# Patient Record
Sex: Male | Born: 1940 | Race: White | Hispanic: No | Smoking: Former smoker
Health system: Southern US, Community
[De-identification: ages and names within clinical notes are randomized; demographics above are authoritative.]

## PROBLEM LIST (undated history)

## (undated) DIAGNOSIS — S46219A Strain of muscle, fascia and tendon of other parts of biceps, unspecified arm, initial encounter: Secondary | ICD-10-CM

## (undated) DIAGNOSIS — M1990 Unspecified osteoarthritis, unspecified site: Secondary | ICD-10-CM

## (undated) DIAGNOSIS — I10 Essential (primary) hypertension: Secondary | ICD-10-CM

## (undated) DIAGNOSIS — C434 Malignant melanoma of scalp and neck: Secondary | ICD-10-CM

## (undated) DIAGNOSIS — K922 Gastrointestinal hemorrhage, unspecified: Secondary | ICD-10-CM

## (undated) DIAGNOSIS — Z87442 Personal history of urinary calculi: Secondary | ICD-10-CM

## (undated) DIAGNOSIS — I779 Disorder of arteries and arterioles, unspecified: Secondary | ICD-10-CM

## (undated) DIAGNOSIS — Z86010 Personal history of colonic polyps: Secondary | ICD-10-CM

## (undated) DIAGNOSIS — M5412 Radiculopathy, cervical region: Secondary | ICD-10-CM

## (undated) DIAGNOSIS — Z9889 Other specified postprocedural states: Secondary | ICD-10-CM

## (undated) DIAGNOSIS — K6389 Other specified diseases of intestine: Secondary | ICD-10-CM

## (undated) DIAGNOSIS — E119 Type 2 diabetes mellitus without complications: Secondary | ICD-10-CM

## (undated) DIAGNOSIS — I739 Peripheral vascular disease, unspecified: Secondary | ICD-10-CM

## (undated) DIAGNOSIS — I480 Paroxysmal atrial fibrillation: Secondary | ICD-10-CM

## (undated) HISTORY — DX: Personal history of urinary calculi: Z87.442

## (undated) HISTORY — DX: Peripheral vascular disease, unspecified: I73.9

## (undated) HISTORY — DX: Type 2 diabetes mellitus without complications: E11.9

## (undated) HISTORY — DX: Unspecified osteoarthritis, unspecified site: M19.90

## (undated) HISTORY — PX: LATERAL FUSION LUMBAR SPINE, THORACOTOMY: SUR634

## (undated) HISTORY — DX: Other specified postprocedural states: Z98.890

## (undated) HISTORY — DX: Paroxysmal atrial fibrillation: I48.0

## (undated) HISTORY — DX: Personal history of colonic polyps: Z86.010

## (undated) HISTORY — DX: Malignant melanoma of scalp and neck: C43.4

## (undated) HISTORY — DX: Strain of muscle, fascia and tendon of other parts of biceps, unspecified arm, initial encounter: S46.219A

## (undated) HISTORY — DX: Gastrointestinal hemorrhage, unspecified: K92.2

## (undated) HISTORY — PX: JOINT REPLACEMENT: SHX530

## (undated) HISTORY — DX: Disorder of arteries and arterioles, unspecified: I77.9

## (undated) HISTORY — DX: Essential (primary) hypertension: I10

## (undated) HISTORY — DX: Radiculopathy, cervical region: M54.12

---

## 2004-11-11 HISTORY — PX: LUMBAR LAMINECTOMY: SHX95

## 2006-03-05 ENCOUNTER — Encounter: Admission: RE | Admit: 2006-03-05 | Discharge: 2006-03-05 | Payer: Self-pay | Admitting: Neurosurgery

## 2006-03-24 ENCOUNTER — Inpatient Hospital Stay (HOSPITAL_COMMUNITY): Admission: RE | Admit: 2006-03-24 | Discharge: 2006-03-25 | Payer: Self-pay | Admitting: Neurosurgery

## 2006-05-01 DIAGNOSIS — M109 Gout, unspecified: Secondary | ICD-10-CM | POA: Insufficient documentation

## 2006-07-04 ENCOUNTER — Encounter: Admission: RE | Admit: 2006-07-04 | Discharge: 2006-07-04 | Payer: Self-pay | Admitting: Neurosurgery

## 2006-07-05 ENCOUNTER — Encounter: Admission: RE | Admit: 2006-07-05 | Discharge: 2006-07-05 | Payer: Self-pay | Admitting: Neurosurgery

## 2006-08-08 ENCOUNTER — Ambulatory Visit: Payer: Self-pay | Admitting: Family Medicine

## 2006-08-11 ENCOUNTER — Ambulatory Visit: Payer: Self-pay | Admitting: Family Medicine

## 2006-09-11 ENCOUNTER — Ambulatory Visit: Payer: Self-pay | Admitting: Family Medicine

## 2006-10-11 ENCOUNTER — Ambulatory Visit: Payer: Self-pay | Admitting: Family Medicine

## 2007-05-20 ENCOUNTER — Ambulatory Visit: Payer: Self-pay | Admitting: Neurosurgery

## 2007-11-12 DIAGNOSIS — N400 Enlarged prostate without lower urinary tract symptoms: Secondary | ICD-10-CM | POA: Insufficient documentation

## 2008-11-09 ENCOUNTER — Ambulatory Visit: Payer: Self-pay | Admitting: Sports Medicine

## 2008-11-11 HISTORY — PX: TOTAL HIP ARTHROPLASTY: SHX124

## 2008-11-11 HISTORY — PX: ULNAR NERVE REPAIR: SHX2594

## 2009-01-19 ENCOUNTER — Encounter: Admission: RE | Admit: 2009-01-19 | Discharge: 2009-01-19 | Payer: Self-pay | Admitting: Neurosurgery

## 2009-02-20 ENCOUNTER — Ambulatory Visit: Payer: Self-pay | Admitting: General Practice

## 2009-03-06 ENCOUNTER — Inpatient Hospital Stay: Payer: Self-pay | Admitting: General Practice

## 2009-05-23 ENCOUNTER — Ambulatory Visit (HOSPITAL_COMMUNITY): Admission: RE | Admit: 2009-05-23 | Discharge: 2009-05-24 | Payer: Self-pay | Admitting: Neurosurgery

## 2009-08-02 ENCOUNTER — Encounter: Payer: Self-pay | Admitting: Cardiovascular Disease

## 2009-08-02 LAB — CONVERTED CEMR LAB: Cholesterol: 165 mg/dL

## 2009-08-03 DIAGNOSIS — K219 Gastro-esophageal reflux disease without esophagitis: Secondary | ICD-10-CM | POA: Insufficient documentation

## 2009-11-11 DIAGNOSIS — C434 Malignant melanoma of scalp and neck: Secondary | ICD-10-CM

## 2009-11-11 HISTORY — PX: NERVE SURGERY: SHX1016

## 2009-11-11 HISTORY — DX: Malignant melanoma of scalp and neck: C43.4

## 2009-11-11 HISTORY — PX: OTHER SURGICAL HISTORY: SHX169

## 2010-01-08 ENCOUNTER — Encounter: Admission: RE | Admit: 2010-01-08 | Discharge: 2010-01-08 | Payer: Self-pay | Admitting: Neurosurgery

## 2010-03-06 ENCOUNTER — Encounter: Payer: Self-pay | Admitting: Cardiovascular Disease

## 2010-03-06 DIAGNOSIS — R972 Elevated prostate specific antigen [PSA]: Secondary | ICD-10-CM

## 2010-03-06 LAB — CONVERTED CEMR LAB
Alkaline Phosphatase: 80 units/L
BUN: 42 mg/dL
Calcium: 9.4 mg/dL
Chloride: 106 meq/L
Creatinine, Ser: 1.34 mg/dL
Glucose, Bld: 104 mg/dL
Hgb A1c MFr Bld: 6.5 %
Potassium: 4.5 meq/L
Sodium: 144 meq/L
TSH: 1.55 microintl units/mL

## 2010-03-09 ENCOUNTER — Ambulatory Visit: Payer: Self-pay | Admitting: General Practice

## 2010-03-28 ENCOUNTER — Encounter: Payer: Self-pay | Admitting: Cardiovascular Disease

## 2010-04-06 ENCOUNTER — Ambulatory Visit: Payer: Self-pay | Admitting: Cardiovascular Disease

## 2010-04-06 DIAGNOSIS — E1121 Type 2 diabetes mellitus with diabetic nephropathy: Secondary | ICD-10-CM | POA: Insufficient documentation

## 2010-04-06 DIAGNOSIS — E782 Mixed hyperlipidemia: Secondary | ICD-10-CM

## 2010-04-06 DIAGNOSIS — R079 Chest pain, unspecified: Secondary | ICD-10-CM | POA: Insufficient documentation

## 2010-04-06 DIAGNOSIS — I10 Essential (primary) hypertension: Secondary | ICD-10-CM

## 2010-07-16 IMAGING — CR DG OUTSIDE FILMS BODY
5 series · 5 of 5 positions shown · non-contrast
Comparison: none

[view not recorded (1 of 5)]
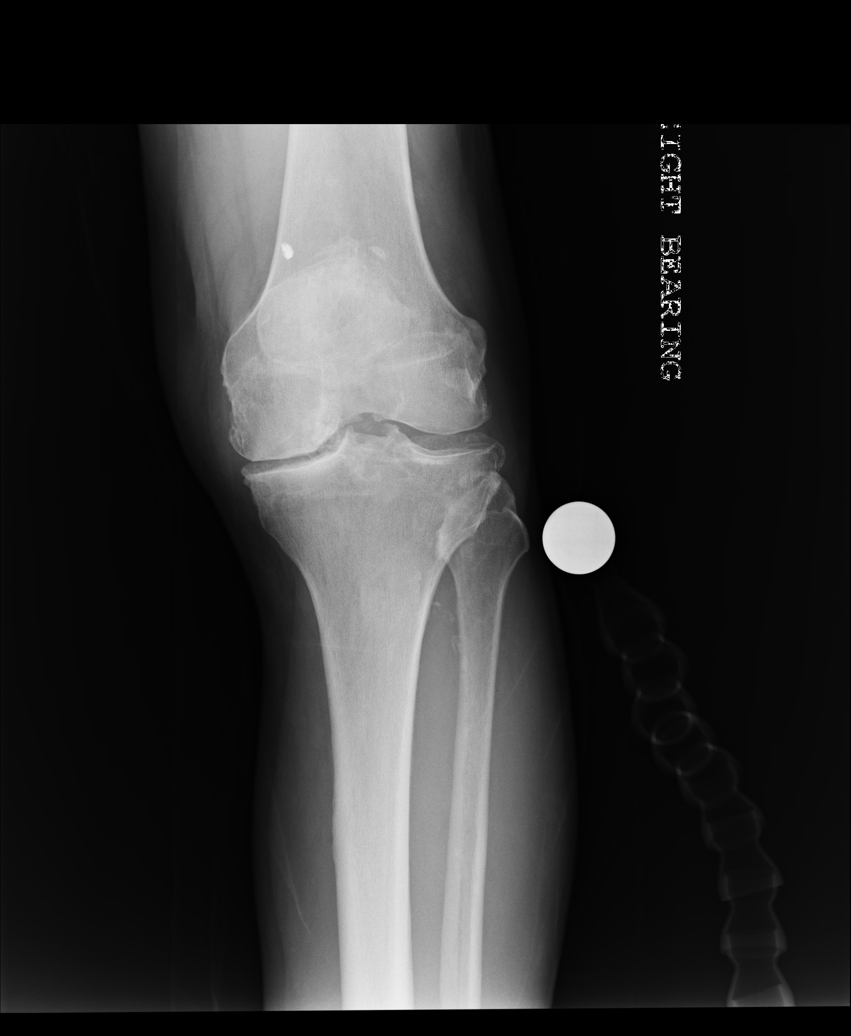

[view not recorded (2 of 5)]
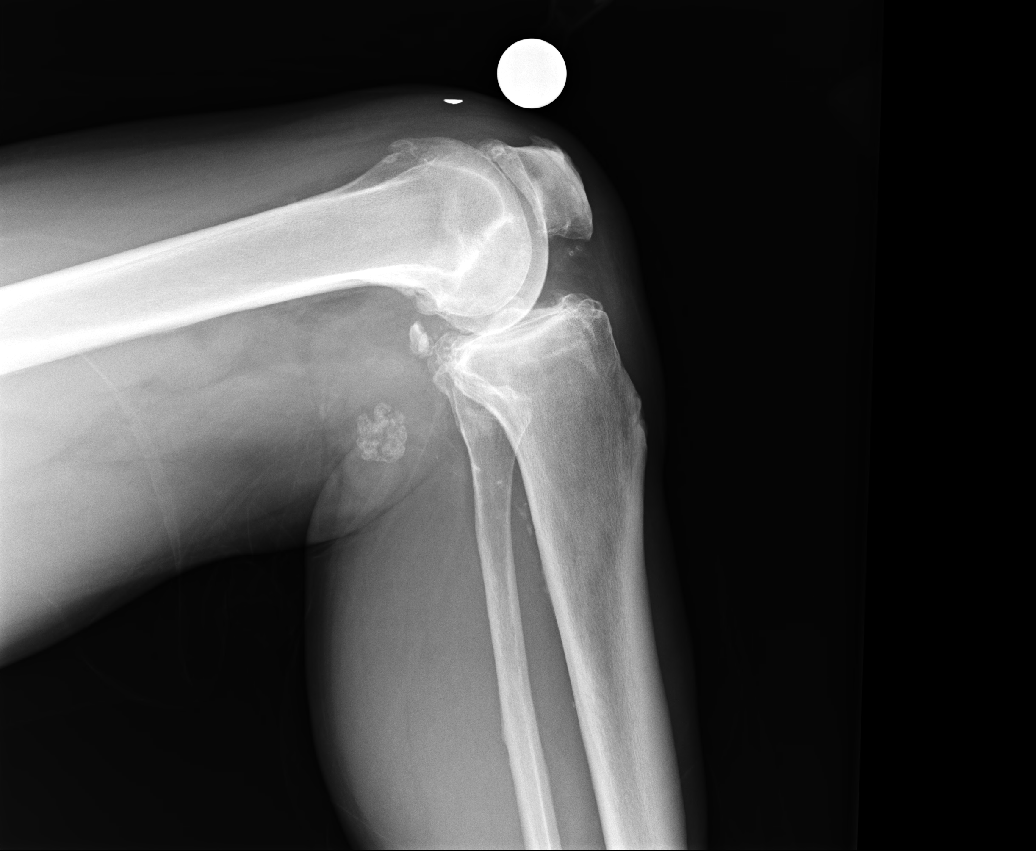

[view not recorded (3 of 5)]
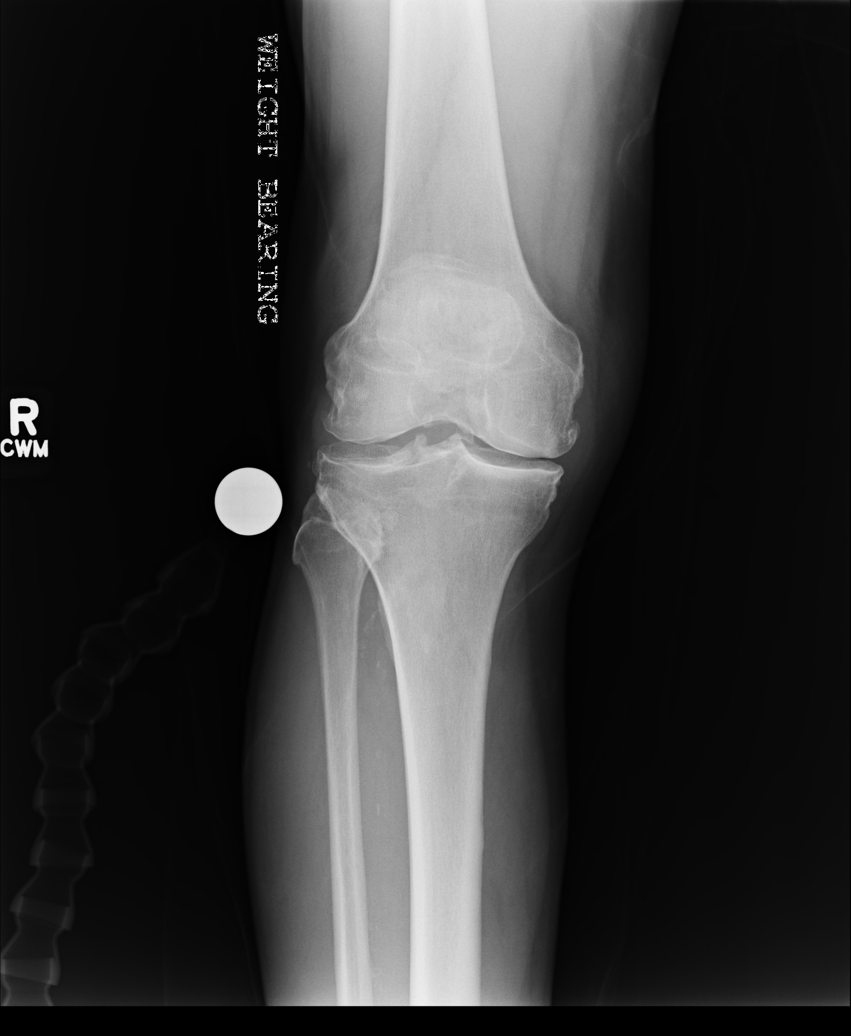

[view not recorded (4 of 5)]
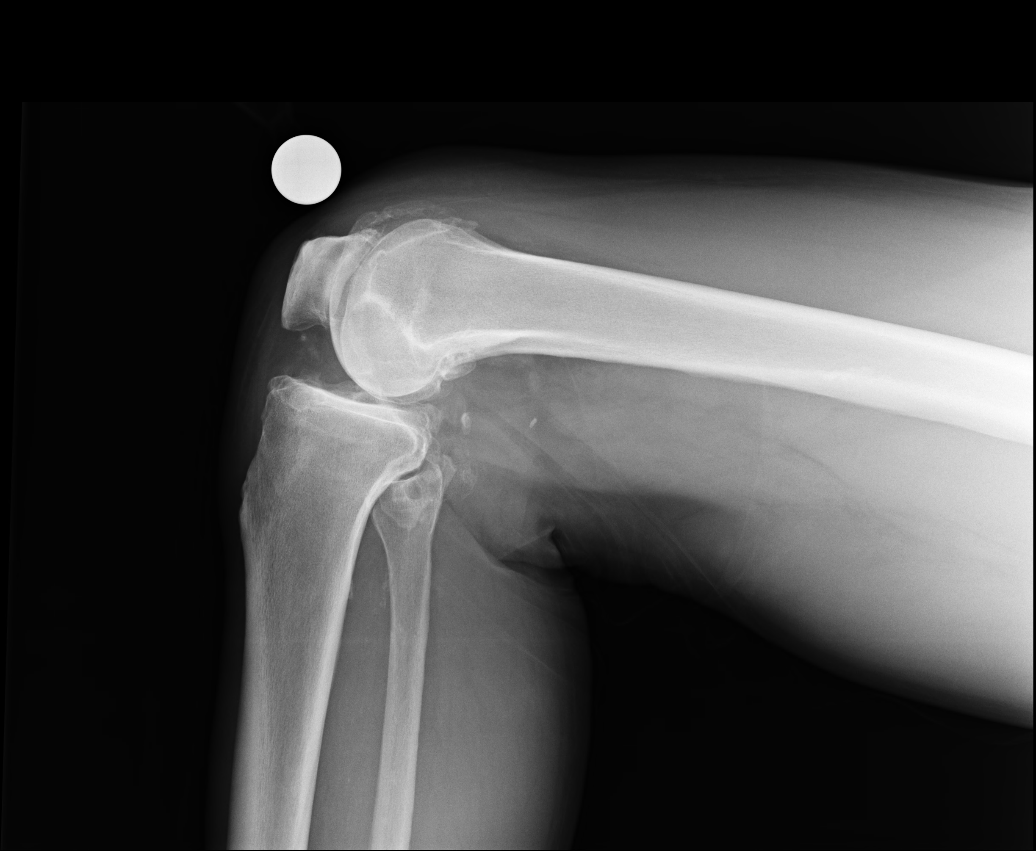

[view not recorded (5 of 5)]
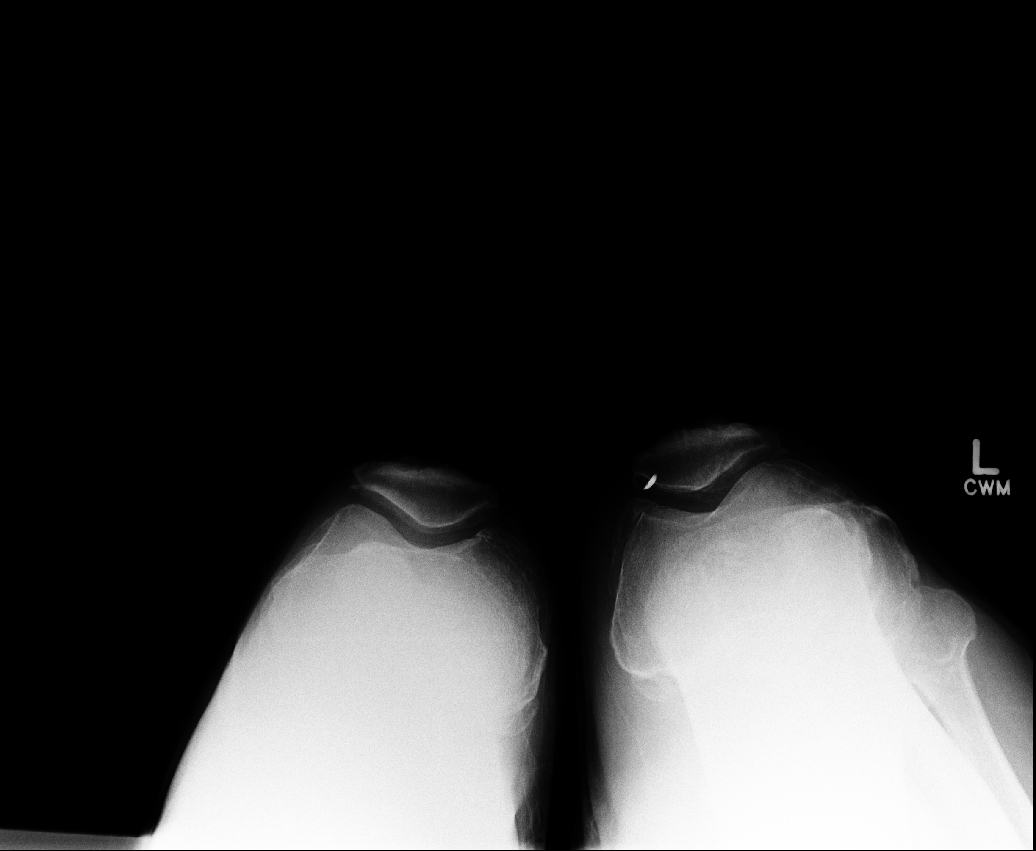

[5 of 5 positions shown; findings below may reference images not displayed]

IMAGES IMPORTED FROM THE SYNGO WORKFLOW SYSTEM
NO DICTATION FOR STUDY

## 2010-07-17 ENCOUNTER — Telehealth: Payer: Self-pay | Admitting: Cardiovascular Disease

## 2010-07-26 ENCOUNTER — Ambulatory Visit: Payer: Self-pay | Admitting: Cardiovascular Disease

## 2010-11-10 ENCOUNTER — Emergency Department: Payer: Self-pay | Admitting: Emergency Medicine

## 2010-11-11 DIAGNOSIS — Z860100 Personal history of colon polyps, unspecified: Secondary | ICD-10-CM

## 2010-11-11 HISTORY — PX: HAND SURGERY: SHX662

## 2010-11-11 HISTORY — DX: Personal history of colon polyps, unspecified: Z86.0100

## 2010-11-11 HISTORY — DX: Personal history of colonic polyps: Z86.010

## 2010-11-16 ENCOUNTER — Emergency Department: Payer: Self-pay | Admitting: Unknown Physician Specialty

## 2010-12-03 ENCOUNTER — Ambulatory Visit: Payer: Self-pay | Admitting: Unknown Physician Specialty

## 2010-12-06 LAB — PATHOLOGY REPORT

## 2010-12-09 ENCOUNTER — Inpatient Hospital Stay: Payer: Self-pay | Admitting: Internal Medicine

## 2010-12-10 DIAGNOSIS — Z860100 Personal history of colon polyps, unspecified: Secondary | ICD-10-CM | POA: Insufficient documentation

## 2010-12-12 LAB — PATHOLOGY REPORT

## 2010-12-13 NOTE — Progress Notes (Signed)
Summary: APPT  Phone Note Call from Patient Call back at Home Phone [phone]   Caller: SELF Call For: [facility] Summary of Call: PT STATES THAT HE HAS NOT HAD ANY PROBLEMS SINCE HIS LAST VISIT AND WOULD LIKE TO KNOW IF IT IS NECESSARY FOR HIM TO COME BACK FOR HIS SCHEDULED APPT Initial call taken by: Harlon Flor,  July 17, 2010 8:31 AM  Follow-up for Phone Call        [facility] TCB Benedict Needy, RN  July 19, 2010 10:22 AM   pt is coming in for his visit 9/15 at 3 pm Follow-up by: Benedict Needy, RN,  July 19, 2010 11:28 AM

## 2010-12-13 NOTE — Assessment & Plan Note (Signed)
Summary: NP6/AMD   Primary Provider:  Mila Merry, MD  CC:  Consult.  History of Present Illness: Bob Christensen is a very pleasant 70 year old gentleman with a history of atrial fibrillation back in 1997, hyperlipidemia, hypertension, diabetes type 2, recently noticed elevated PSA and symptoms of erectile dysfunction and lethargy who presents with episodes of chest pain, chest tightness, shortness of breath and feeling tired.  He reports that over the past several weeks he has had episodes of chest discomfort. He saw Dr. Sherrie Mustache and was started on a proton pump inhibitor. After one to 2 weeks, he thinks his symptoms have improved though he is not sure. He has periods where he feels very lethargic and wants to sleep in bed all day. Just has some shortness of breath with exertion.  He has significant arthritis pain, history of left hip replacement, left knee problems as well as other joints due to her long history of plumbing. He stopped smoking in 1989.  He reports having a cardiac catheterization when he had his atrial fibrillation in 1997 and reports this was normal showing no significant disease.  Labs from March 06, 2010 show PSA 12.9, labs from August 02, 2009 showed total cholesterol 165, HDL 35  Current Medications (verified): 1)  Diltiazem Hcl Er Beads 240 Mg Xr24h-Cap (Diltiazem Hcl Er Beads) .... Take One Capsule By Mouth Daily 2)  Lisinopril-Hydrochlorothiazide 20-12.5 Mg Tabs (Lisinopril-Hydrochlorothiazide) .... Take 1 By Mouth Once Daily 3)  Simvastatin 40 Mg Tabs (Simvastatin) .... Take One Tablet By Mouth Daily At Bedtime 4)  Aspirin 81 Mg Tbec (Aspirin) .... Take One Tablet By Mouth Daily 5)  Nexium 40 Mg Cpdr (Esomeprazole Magnesium) .... Take 1 By Mouth Once Daily 6)  Allopurinol 100 Mg Tabs (Allopurinol) .... Take 1 By Mouth Two Times A Day 7)  Gabapentin 600 Mg Tabs (Gabapentin) .... Take 1 By Mouth Three Times A Day 8)  Percocet 5-325 Mg Tabs (Oxycodone-Acetaminophen)  .... Take 1 By Mouth Three Times A Day As Needed 9)  Centrum Silver  Tabs (Multiple Vitamins-Minerals) .... Take 1 By Mouth Once Daily 10)  Glucosamine-Chondroitin  Caps (Glucosamine-Chondroit-Vit C-Mn) .... Once Daily  Allergies (verified): 1)  ! Lanoxin  Past History:  Past Medical History: Last updated: 03/28/2010 Diabetes Type 2 G E R D Hyperlipidemia Hypertension  Social History: Last updated: 03/28/2010 Tobacco Use - Former.  Alcohol Use - yes  Risk Factors: Smoking Status: quit (03/28/2010)  Review of Systems       The patient complains of chest pain and dyspnea on exertion.  The patient denies fever, weight loss, weight gain, vision loss, decreased hearing, hoarseness, syncope, peripheral edema, prolonged cough, headaches, hemoptysis, abdominal pain, incontinence, muscle weakness, depression, and enlarged lymph nodes.    Vital Signs:  Patient profile:   70 year old male Height:      69 inches Weight:      209 pounds BMI:     30.98 Pulse rate:   66 / minute BP sitting:   142 / 68  (left arm) Cuff size:   regular  Vitals Entered By: Stanton Kidney, EMT-P (Apr 06, 2010 11:11 AM)  Physical Exam  General:  Well developed, well nourished, in no acute distress. Head:  normocephalic and atraumatic Neck:  Neck supple, no JVD. No masses, thyromegaly or abnormal cervical nodes. Chest Wall:  no deformities or breast masses noted Lungs:  Clear bilaterally to auscultation and percussion. Heart:  Non-displaced PMI, chest non-tender; regular rate and rhythm, S1, S2 with  I-II/VI SEM RSB, rubs or gallops. Carotid upstroke normal, no bruit.  Pedals normal pulses. No edema, no varicosities. Abdomen:  Bowel sounds positive; abdomen soft and non-tender without masses, organomegaly, or hernias noted. No hepatosplenomegaly. Msk:  Back normal, normal gait. Muscle strength and tone normal. Pulses:  pulses normal in all 4 extremities Extremities:  No clubbing or cyanosis. Neurologic:   Alert and oriented x 3. Skin:  Intact without lesions or rashes. Psych:  Normal affect.    EKG  Procedure date:  03/23/2010  Findings:      normal sinus rhythm with rate 59 beats per minute, no significant ST or T wave changes noted.  Impression & Recommendations:  Problem # 1:  CHEST PAIN-UNSPECIFIED (ICD-786.50) etiology of chest pain is uncertain. It has improved on proton pump inhibitor though he continues to have it to some degree. We did discuss the options for treatment, including doing nothing with medical management, stress testing as he had previously he reports 6 years ago.  He would prefer to hold off for now as he is starting to feel a little bit better. We have suggested to him that he contact us if his symptoms get worse, particularly with exertion. I have given him some nitroglycerin in case he has chest pain that is not relieved with rest. We'll see him back in followup in several months time.  His updated medication list for this problem includes:    Diltiazem Hcl Er Beads 240 Mg Xr24h-cap (Diltiazem hcl er beads) .Marland Kitchen... Take one capsule by mouth daily    Lisinopril-hydrochlorothiazide 20-12.5 Mg Tabs (Lisinopril-hydrochlorothiazide) .Marland Kitchen... Take 1 by mouth once daily    Aspirin 81 Mg Tbec (Aspirin) .Marland Kitchen... Take one tablet by mouth daily    Nitrostat 0.4 Mg Subl (Nitroglycerin) .Marland Kitchen... 1 tablet under tongue at onset of chest pain; you may repeat every 5 minutes for up to 3 doses.  Problem # 2:  HYPERTENSION, BENIGN (ICD-401.1) Blood pressures well controlled on his current medication regimen. He has been on diltiazem for his atrial fibrillation for over 10 years and appears to be doing well with no symptoms of tachycardia palpitations.  His updated medication list for this problem includes:    Diltiazem Hcl Er Beads 240 Mg Xr24h-cap (Diltiazem hcl er beads) .Marland Kitchen... Take one capsule by mouth daily    Lisinopril-hydrochlorothiazide 20-12.5 Mg Tabs  (Lisinopril-hydrochlorothiazide) .Marland Kitchen... Take 1 by mouth once daily    Aspirin 81 Mg Tbec (Aspirin) .Marland Kitchen... Take one tablet by mouth daily  Problem # 3:  DIAB W/O COMP TYPE II/UNS NOT STATED UNCNTRL (ICD-250.00) Borderline diabetes which he controls with diet.  His updated medication list for this problem includes:    Lisinopril-hydrochlorothiazide 20-12.5 Mg Tabs (Lisinopril-hydrochlorothiazide) .Marland Kitchen... Take 1 by mouth once daily    Aspirin 81 Mg Tbec (Aspirin) .Marland Kitchen... Take one tablet by mouth daily  Problem # 4:  HYPERLIPIDEMIA-MIXED (ICD-272.4) Cholesterol is well controlled on simvastatin 40 mg daily with total cholesterol 165. He does have a smoking history though stopped 20 years ago. He is on aspirin.  His updated medication list for this problem includes:    Simvastatin 40 Mg Tabs (Simvastatin) .Marland Kitchen... Take one tablet by mouth daily at bedtime  Patient Instructions: 1)  Your physician recommends that you schedule a follow-up appointment in: 3 months 2)  Your physician has recommended you make the following change in your medication: keep Nitro on hand in case any chest pains Prescriptions: NITROSTAT 0.4 MG SUBL (NITROGLYCERIN) 1 tablet under tongue at onset  of chest pain; you may repeat every 5 minutes for up to 3 doses.  #25 x 6   Entered by:   Hardin Negus, RMA   Authorized by:   Dossie Arbour MD   Signed by:   Hardin Negus, RMA on 04/06/2010   Method used:   Electronically to        [facility] (retail)       [address], [address]       [place], [place]  [address]       Ph: [phone]       Fax: [phone]   RxID:   [id number]

## 2010-12-13 NOTE — Letter (Signed)
Summary: PHI  PHI   Imported By: Harlon Flor 04/10/2010 16:37:03  _____________________________________________________________________  External Attachment:    Type:   Image     Comment:   External Document

## 2010-12-13 NOTE — Assessment & Plan Note (Signed)
Summary: F3M/AMD   Visit Type:  Follow-up Primary Provider:  Mila Merry, MD  CC:  Denies chest pain or shortness of breath..  History of Present Illness: Bob Christensen is a very pleasant 70 year old gentleman with a history of atrial fibrillation back in 1997, hyperlipidemia, hypertension, diabetes type 2,  elevated PSA and symptoms of erectile dysfunction and letharg, previous episodes of chest pain, chest tightness, shortness of breath, who presents for routine followup.  Since his last office visit, he has been on a proton pump inhibitor and has felt well with no more significant chest discomfort. We gave him samples of Nexium and he actually went back to Prevacid with good relief of his discomfort. He continues to have fatigue though he is able to exert himself without any shortness of breath or chest tightness. His fatigue is chronic. he has been very active, painting, climbing up ladders and working on his house with no symptoms of chest discomfort.  He has significant arthritis pain, history of left hip replacement, left knee problems as well as other joints due to her long history of plumbing. He stopped smoking in 1989.  He reports having a cardiac catheterization when he had his atrial fibrillation in 1997 and reports this was normal showing no significant disease.   labs from August 02, 2009 showed total cholesterol 165, HDL 35  EKG shows normal sinus rhythm with rate of 71 beats per minute, no significant ST or T wave changes  Preventive Screening-Counseling & Management  Caffeine-Diet-Exercise     Does Patient Exercise: no  Current Medications (verified): 1)  Diltiazem Hcl Er Beads 240 Mg Xr24h-Cap (Diltiazem Hcl Er Beads) .... Take One Capsule By Mouth Daily 2)  Lisinopril-Hydrochlorothiazide 20-12.5 Mg Tabs (Lisinopril-Hydrochlorothiazide) .... Take 1 By Mouth Once Daily 3)  Simvastatin 40 Mg Tabs (Simvastatin) .... Take One Tablet By Mouth Daily At Bedtime 4)  Aspirin  81 Mg Tbec (Aspirin) .... Take One Tablet By Mouth Daily 5)  Nexium 40 Mg Cpdr (Esomeprazole Magnesium) .... Take 1 By Mouth Once Daily 6)  Allopurinol 100 Mg Tabs (Allopurinol) .... Take 1 By Mouth Two Times A Day 7)  Gabapentin 600 Mg Tabs (Gabapentin) .... Take 1 By Mouth Three Times A Day 8)  Percocet 5-325 Mg Tabs (Oxycodone-Acetaminophen) .... Take 1 By Mouth Three Times A Day As Needed 9)  Centrum Silver  Tabs (Multiple Vitamins-Minerals) .... Take 1 By Mouth Once Daily 10)  Glucosamine-Chondroitin  Caps (Glucosamine-Chondroit-Vit C-Mn) .... Once Daily 11)  Nitrostat 0.4 Mg Subl (Nitroglycerin) .Marland Kitchen.. 1 Tablet Under Tongue At Onset of Chest Pain; You May Repeat Every 5 Minutes For Up To 3 Doses. 12)  Finasteride 5 Mg Tabs (Finasteride) .... One Tablet Once Daily 13)  Tamsulosin Hcl 0.4 Mg Caps (Tamsulosin Hcl) .... One Tablet Once Daily  Allergies (verified): 1)  ! Lanoxin  Past History:  Past Surgical History: hip replacement  melanoma on head lower back surg.  Social History: Tobacco Use - Former.  Alcohol Use - yes--rare Retired Divorced  Regular Exercise - no Does Patient Exercise:  no  Review of Systems  The patient denies fever, weight loss, weight gain, vision loss, decreased hearing, hoarseness, chest pain, syncope, dyspnea on exertion, peripheral edema, prolonged cough, abdominal pain, incontinence, muscle weakness, depression, and enlarged lymph nodes.         fatigue  Vital Signs:  Patient profile:   70 year old male Height:      69 inches Weight:      212  pounds BMI:     31.42 Pulse rate:   72 / minute BP sitting:   130 / 62  (left arm) Cuff size:   regular  Vitals Entered By: Bishop Dublin, CMA (July 26, 2010 2:45 PM)  Physical Exam  General:  Well developed, well nourished, in no acute distress. Head:  normocephalic and atraumatic Neck:  Neck supple, no JVD. No masses, thyromegaly or abnormal cervical nodes. Chest Wall:  no deformities or  breast masses noted Lungs:  Clear bilaterally to auscultation and percussion. Heart:  Non-displaced PMI, chest non-tender; regular rate and rhythm, S1, S2 with I-II/VI SEM RSB, rubs or gallops. Carotid upstroke normal, no bruit.  Pedals normal pulses. No edema, no varicosities. Abdomen:  abdomen soft and non-tender without masses Msk:  Back normal, normal gait. Muscle strength and tone normal. Pulses:  pulses normal in all 4 extremities Extremities:  No clubbing or cyanosis. Neurologic:  Alert and oriented x 3. Skin:  Intact without lesions or rashes. Psych:  Normal affect.   Impression & Recommendations:  Problem # 1:  CHEST PAIN-UNSPECIFIED (ICD-786.50) no further episodes of chest pain with exertion. He does have chronic fatigue. It is uncertain if this is due to aging or something else. He does not have typical angina. He is not interested in performing a stress test at this time as we offered previously on his last visit. I've asked him to contact me if he has worsening symptoms with exertion including chest pain, shortness of breath.  His updated medication list for this problem includes:    Diltiazem Hcl Er Beads 240 Mg Xr24h-cap (Diltiazem hcl er beads) .Marland Kitchen... Take one capsule by mouth daily    Lisinopril-hydrochlorothiazide 20-12.5 Mg Tabs (Lisinopril-hydrochlorothiazide) .Marland Kitchen... Take 1 by mouth once daily    Aspirin 81 Mg Tbec (Aspirin) .Marland Kitchen... Take one tablet by mouth daily    Nitrostat 0.4 Mg Subl (Nitroglycerin) .Marland Kitchen... 1 tablet under tongue at onset of chest pain; you may repeat every 5 minutes for up to 3 doses.  Problem # 2:  HYPERLIPIDEMIA-MIXED (ICD-272.4) We'll continue him on his simvastatin 40 mg daily. Ideally, his simvastatin dose should be decreased to 20 mg daily Given that he is on diltiazem though he has no symptoms of myalgias. He will see Dr. Sherrie Mustache in several weeks' time. We'll try to obtain any lab work done at that time if available.  His updated medication  list for this problem includes:    Simvastatin 40 Mg Tabs (Simvastatin) .Marland Kitchen... Take one tablet by mouth daily at bedtime  Problem # 3:  HYPERTENSION, BENIGN (ICD-401.1) his blood pressure is reasonably well controlled. We have made no changes at this time.  His updated medication list for this problem includes:    Diltiazem Hcl Er Beads 240 Mg Xr24h-cap (Diltiazem hcl er beads) .Marland Kitchen... Take one capsule by mouth daily    Lisinopril-hydrochlorothiazide 20-12.5 Mg Tabs (Lisinopril-hydrochlorothiazide) .Marland Kitchen... Take 1 by mouth once daily    Aspirin 81 Mg Tbec (Aspirin) .Marland Kitchen... Take one tablet by mouth daily  Other Orders: EKG w/ Interpretation (93000)

## 2011-01-10 ENCOUNTER — Other Ambulatory Visit: Payer: Self-pay | Admitting: Neurosurgery

## 2011-01-10 DIAGNOSIS — M47816 Spondylosis without myelopathy or radiculopathy, lumbar region: Secondary | ICD-10-CM

## 2011-01-18 ENCOUNTER — Ambulatory Visit
Admission: RE | Admit: 2011-01-18 | Discharge: 2011-01-18 | Disposition: A | Discharge status: Home / Self Care | Payer: Medicare Other | Source: Ambulatory Visit | Attending: Neurosurgery | Admitting: Neurosurgery

## 2011-01-18 DIAGNOSIS — M47816 Spondylosis without myelopathy or radiculopathy, lumbar region: Secondary | ICD-10-CM

## 2011-02-11 ENCOUNTER — Other Ambulatory Visit: Payer: Self-pay | Admitting: Neurosurgery

## 2011-02-11 DIAGNOSIS — M5126 Other intervertebral disc displacement, lumbar region: Secondary | ICD-10-CM

## 2011-02-15 ENCOUNTER — Ambulatory Visit
Admission: RE | Admit: 2011-02-15 | Discharge: 2011-02-15 | Disposition: A | Discharge status: Home / Self Care | Payer: Medicare Other | Source: Ambulatory Visit | Attending: Neurosurgery | Admitting: Neurosurgery

## 2011-02-15 ENCOUNTER — Other Ambulatory Visit: Payer: Self-pay | Admitting: Neurosurgery

## 2011-02-15 DIAGNOSIS — M5126 Other intervertebral disc displacement, lumbar region: Secondary | ICD-10-CM

## 2011-02-17 LAB — URINE MICROSCOPIC-ADD ON

## 2011-02-17 LAB — DIFFERENTIAL
Basophils Absolute: 0.1 10*3/uL (ref 0.0–0.1)
Basophils Relative: 1 % (ref 0–1)
Eosinophils Absolute: 0.1 10*3/uL (ref 0.0–0.7)
Lymphocytes Relative: 31 % (ref 12–46)
Lymphs Abs: 2.7 10*3/uL (ref 0.7–4.0)
Monocytes Absolute: 0.5 10*3/uL (ref 0.1–1.0)
Monocytes Relative: 6 % (ref 3–12)

## 2011-02-17 LAB — CBC
HCT: 35.3 % — ABNORMAL LOW (ref 39.0–52.0)
MCHC: 33.7 g/dL (ref 30.0–36.0)
MCV: 85.1 fL (ref 78.0–100.0)
RBC: 4.15 MIL/uL — ABNORMAL LOW (ref 4.22–5.81)
WBC: 8.5 10*3/uL (ref 4.0–10.5)

## 2011-02-17 LAB — COMPREHENSIVE METABOLIC PANEL
AST: 26 U/L (ref 0–37)
Alkaline Phosphatase: 71 U/L (ref 39–117)
BUN: 25 mg/dL — ABNORMAL HIGH (ref 6–23)
CO2: 26 mEq/L (ref 19–32)
Calcium: 9.4 mg/dL (ref 8.4–10.5)
GFR calc Af Amer: >60 mL/min (ref >60)
Glucose, Bld: 183 mg/dL — ABNORMAL HIGH (ref 70–99)
Sodium: 143 mEq/L (ref 135–145)

## 2011-02-17 LAB — URINALYSIS, ROUTINE W REFLEX MICROSCOPIC
Nitrite: NEGATIVE
Protein, ur: NEGATIVE mg/dL
Specific Gravity, Urine: 1.019 (ref 1.005–1.030)
Urobilinogen, UA: 0.2 mg/dL (ref 0.0–1.0)
pH: 5 (ref 5.0–8.0)

## 2011-02-17 LAB — APTT: aPTT: 34 seconds (ref 24–37)

## 2011-02-17 LAB — GLUCOSE, CAPILLARY

## 2011-02-17 LAB — PROTIME-INR: Prothrombin Time: 13.2 seconds (ref 11.6–15.2)

## 2011-03-26 NOTE — Op Note (Signed)
NAME:  Bob, Christensen NO.:  [id number]   MEDICAL RECORD NO.:  [id number]          PATIENT TYPE:  OIB   LOCATION:  3004                         FACILITY:  [facility]   PHYSICIAN:  Payton Doughty, M.D.      DATE OF BIRTH:  Oct 11, 1941   DATE OF PROCEDURE:  05/23/2009  DATE OF DISCHARGE:                               OPERATIVE REPORT   PREOPERATIVE DIAGNOSIS:  Left ulnar neuropathy.   POSTOPERATIVE DIAGNOSIS:  Left ulnar neuropathy.   OPERATIVE PROCEDURE:  Left ulnar nerve transposition.   DICTATING DOCTOR:  Payton Doughty, MD, Neurosurgery.   ANESTHESIA:  General endotracheal.   PREPARATION:  Prepped and scrubbed with Betadine paint.   COMPLICATIONS:  None.   ASSISTANT:  Kathleene Hazel, RN   BODY OF TEXT:  This is a 70 year old gentleman who injured his elbow as  a youth and has developed a progressive ulnar neuropathy over the past 4-  5 years.  He was taken to the operating room, smoothly anesthetized and  intubated, placed spine on the operating table.  Following shave, prep,  and drape in the usual sterile fashion, skin was marked and infiltrated  with 1% lidocaine with 1:400,000 epinephrine.  Skin incision was started  on the upper arm about 5-8 cm above the median epicondyle on the line  with the median intermuscular septum.  It was then carried up over the  antecubital space and then back down over the flexor carpi ulnaris for a  distance of approximately 8-10 cm.  Flap was carried down to the fascia,  dissected in the subcutaneous plane.  The ulnar nerve was identified  superior to the medial intermuscular septum was dissected free down to  the ulnar groove.  This was dissected free through the groove and was  quite scarred down with a lot of scar tissue around it apparently from  the patient's elbow trauma.  The flexor carpi ulnaris was then split and  the nerve mobilized with several nerve fascicles being dissected up onto  the main trunk of the nerve to allow  mobilization.  Initially, the nerve  roots stimulated slightly at 2 mA current with a handheld stimulator.  Once it was completely decompressed, the stimulation threshold dropped  down to a half and there was a very robust response.  The nerve was  transposed over the median epicondyle and held in place with the  subcutaneous 3-0 silks that held the transposition in place.  Good  stimulation was obtained.  The wound was irrigated and hemostasis was  assured.  Successive layers of 0 Vicryl and 3-0 nylon were used to  close.  Betadine and Telfa dressing was applied and made occlusive with  OpSite.  The patient returned to the recovery room in good condition.          ______________________________  Payton Doughty, M.D.    MWR/MEDQ  D:  05/23/2009  T:  05/24/2009  Job:  [id number]

## 2011-03-26 NOTE — H&P (Signed)
NAME:  RAEGAN, SIPP NO.:  [id number]   MEDICAL RECORD NO.:  [id number]          PATIENT TYPE:  INP   LOCATION:  4735                         FACILITY:  [facility]   PHYSICIAN:  Payton Doughty, M.D.      DATE OF BIRTH:  [birth year]   DATE OF ADMISSION:  11/23/2008  DATE OF DISCHARGE:  11/24/2008                              HISTORY & PHYSICAL   ADMITTING DIAGNOSIS:  Left ulnar neuropathy.   BODY OF TEXT:  A very nice, now 70 year old left-handed white gentleman,  who I have been seen for back pain for some time.  He over a couple of  visits ago mentioned that his left hand was bothering him, getting  weaker, and that he had had a left elbow injury as a child.  He now has  weakness in his interossei, positive Tinel over the left ulnar nerve,  and is admitted for left ulnar nerve transposition.   PAST MEDICAL HISTORY:  Medical history is benign.   PAST SURGICAL HISTORY:  Lumbar laminectomy, as well as a hernia repair.   ALLERGIES:  LANOXIN.   MEDICATIONS:  Diltiazem, triamterene and hydrochlorothiazide,  lisinopril, allopurinol, Prilosec, and Percocet.   FAMILY HISTORY:  Mother died at 31.  Father died at 2.   SOCIAL HISTORY:  Does not smoke, does not drink.  He is a Visual merchandiser, likes  to get out and hunt, but cannot pull the bow because of his back and his  arm.   REVIEW OF SYSTEMS:  Marked for glasses, cataracts, nasal congestion,  hypertension, leg pain, leg weakness, back pain, neck pain, and skin  cancers.   PHYSICAL EXAMINATION:  HEENT:  Within normal limits.  NECK:  He has reasonable range of motion of his neck.  CHEST:  Clear.  CARDIAC:  Regular rate and rhythm.  ABDOMEN:  Nontender.  No hepatosplenomegaly.  EXTREMITIES:  Without clubbing or cyanosis.  Peripheral pulses are good.  GENITOURINARY:  Deferred.  NEUROLOGIC:  He is awake, alert, and oriented.  Cranial nerves intact.  Motor exam shows 5/5 strength throughout the right upper and both lower  extremities save for the dorsiflexion of the right foot is 5-/5.  His  left upper extremity has weakness in interossei.  He also has weakness  of the ulnar to flexors.  Reflexes are 1 in the upper extremities.  Mikey Bussing is negative.  He also has a positive Tinel over the left ulnar  nerve and the nerve feels weak, and it feels loose in the ulnar groove.   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   The plan is for left ulnar nerve transposition.  The risks and benefits  have been discussed with him, and he wished to proceed.             ______________________________  Payton Doughty, M.D.     MWR/MEDQ  D:  05/23/2009  T:  05/23/2009  Job:  [id number]

## 2011-03-29 NOTE — Op Note (Signed)
NAME:  Bob Christensen, Bob Christensen NO.:  [id number]   MEDICAL RECORD NO.:  [id number]          PATIENT TYPE:  INP   LOCATION:  3021                         FACILITY:  [facility]   PHYSICIAN:  Kathaleen Maser. Pool, M.D.    DATE OF BIRTH:  [birth year]   DATE OF PROCEDURE:  03/24/2006  DATE OF DISCHARGE:                                 OPERATIVE REPORT   ATTENDING PHYSICIAN:  Sherilyn Cooter A. Pool, M.D.   SERVICE:  Neurosurgery.   PREOPERATIVE DIAGNOSIS:  Lumbar stenosis.   POSTOPERATIVE DIAGNOSIS:  Lumbar stenosis.   PROCEDURE:  Bilateral L3-4 and L4-5 decompressive laminotomies and  foraminotomies.   SURGEON:  Kathaleen Maser. Pool, M.D.   ASSISTANT:  Donalee Citrin, M.D.   ANESTHESIA:  General oral endotracheal.   INDICATION:  [name] is a 70 year old male with history of lower  extremity pain consistent with neurogenic claudication, failing conservative  management.  Workup demonstrates evidence of multilevel degenerative disease  with associated spondylosis and stenosis at L3-4 and L4-5.  The patient  presents now for bilateral L3-4 and L4-5 decompressive laminotomies and  foraminotomies in hopes of improving his symptoms.   OPERATIVE NOTE:  The patient was taken to the operating room and placed on  the table a supine position.  After an adequate level of anesthesia was  achieved, the patient was positioned prone on the Wilson frame and  appropriately padded.  The patient's lumbar region was prepped and draped  sterilely.  A 10 blade was used to make a linear skin incision overlying the  L3 and 4 and 5 levels.  This was carried down sharply in the midline.  A  subperiosteal dissection was then performed, exposing the lamina and facet  joints of L3, L4 and L5.  A deep self-retainer was placed.  Intraoperative x-  rays were taken and level was confirmed.  A decompressive laminotomy was  then performed bilaterally using a high-speed drill and Kerrison rongeurs to  remove the inferior aspect of  the lamina of L3, the superior aspect of the  lamina of L4, the inferior aspect of the lamina of L4 and the superior  aspect of the lamina of L5, as well as the medial aspect of the L3-4 and L4-  5 facet joints bilaterally.  Ligament flavum was then elevated and resected  in piecemeal fashion using Kerrison rongeurs.  The underlying thecal sac and  exiting L4 and L5 nerve roots were identified.  A wide decompressive  foraminotomy was then formed along the course of the exiting nerve roots.  At this point, a very thorough decompression had been achieved bilaterally.  There was no evidence of any residual compression.  Wound was then irrigated  with antibiotic solution.  Gelfoam was placed topically for hemostasis,  which was found to be good.  Microscope and retractor system were removed.  Hemostasis  in the muscle was achieved with electrocautery.  The wound was closed in  layers with Vicryl sutures.  Steri-Strips and sterile dressing were applied.  There were no apparent operative complications.  The patient tolerated the  procedure well and he returns  to the recovery room postoperatively.           ______________________________  Kathaleen Maser Pool, M.D.     HAP/MEDQ  D:  03/24/2006  T:  03/25/2006  Job:  [id number]

## 2011-06-04 ENCOUNTER — Encounter: Payer: Self-pay | Admitting: Cardiovascular Disease

## 2011-07-16 ENCOUNTER — Encounter: Payer: Self-pay | Admitting: Cardiovascular Disease

## 2011-07-18 ENCOUNTER — Ambulatory Visit: Payer: Medicare Other | Admitting: Cardiovascular Disease

## 2011-07-25 ENCOUNTER — Encounter: Payer: Self-pay | Admitting: Cardiovascular Disease

## 2011-07-25 ENCOUNTER — Ambulatory Visit (INDEPENDENT_AMBULATORY_CARE_PROVIDER_SITE_OTHER): Payer: Medicare Other | Admitting: Cardiovascular Disease

## 2011-07-25 DIAGNOSIS — R079 Chest pain, unspecified: Secondary | ICD-10-CM

## 2011-07-25 DIAGNOSIS — E119 Type 2 diabetes mellitus without complications: Secondary | ICD-10-CM

## 2011-07-25 DIAGNOSIS — I10 Essential (primary) hypertension: Secondary | ICD-10-CM

## 2011-07-25 DIAGNOSIS — M79643 Pain in unspecified hand: Secondary | ICD-10-CM

## 2011-07-25 DIAGNOSIS — M79609 Pain in unspecified limb: Secondary | ICD-10-CM

## 2011-07-25 DIAGNOSIS — E785 Hyperlipidemia, unspecified: Secondary | ICD-10-CM

## 2011-07-25 NOTE — Assessment & Plan Note (Signed)
His symptoms are of hand and finger numbness tingling and pain is concerning for possible carpal tunnel disease. I have asked him to talk with Dr. Sherrie Mustache to see if a workup might be appropriate.

## 2011-07-25 NOTE — Patient Instructions (Signed)
You are doing well. No medication changes were made. Please call us if you have new issues that need to be addressed before your next appt.  We will call you for a follow up Appt. In 12 months  

## 2011-07-25 NOTE — Progress Notes (Signed)
Patient ID: Bob Christensen, male    DOB: [birth year], 70 y.o.   MRN: [id number]  HPI Comments: Mr. Norgaard is a very pleasant 70 year old gentleman with a history of atrial fibrillation back in 1997, hyperlipidemia, hypertension, diabetes type 2,  elevated PSA and symptoms of erectile dysfunction and lethargy, previous episodes of chest pain,  shortness of breath, who presents for routine followup.   He reports that overall he is doing well. He is very sleepy much of the day and reports that he could sleep all day long if he could. His biggest concern is the pain in his hands and to a more mild extent, his feet. He has difficulty sleeping at night time secondary to the tingling and pain and tightness in his hands. This has been going on for some time. 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 He does have left hip and knee pain as well.  He denies any significant chest pain, shortness of breath and is otherwise feeling well.    history of left hip replacement. He stopped smoking in 1989. He reports having a cardiac catheterization when he had his atrial fibrillation in 1997 and reports this was normal showing no significant disease.    labs from August 02, 2009 showed total cholesterol 165, HDL 35   EKG shows normal sinus rhythm with rate of 57 beats per minute, no significant ST or T wave changes    Outpatient Encounter Prescriptions as of 07/25/2011  Medication Sig Dispense Refill  . allopurinol (ZYLOPRIM) 100 MG tablet Take 100 mg by mouth 2 (two) times daily.        Marland Kitchen aspirin 81 MG EC tablet Take 81 mg by mouth daily.        Marland Kitchen diltiazem (CARDIZEM CD) 240 MG 24 hr capsule Take 240 mg by mouth daily.        . finasteride (PROSCAR) 5 MG tablet Take 5 mg by mouth daily.        Marland Kitchen gabapentin (NEURONTIN) 600 MG tablet Take 600 mg by mouth 3 (three) times daily.        Marland Kitchen GLUCOSAMINE-CHONDROITIN-VIT C PO Take 1 capsule by mouth daily.        Marland Kitchen  lisinopril-hydrochlorothiazide (PRINZIDE,ZESTORETIC) 20-12.5 MG per tablet Take 0.5 tablets by mouth daily.       . Multiple Vitamins-Minerals (CENTRUM SILVER PO) Take 1 tablet by mouth daily.        . nitroGLYCERIN (NITROSTAT) 0.4 MG SL tablet Place 0.4 mg under the tongue every 5 (five) minutes as needed.        . simvastatin (ZOCOR) 40 MG tablet Take 40 mg by mouth at bedtime.        . Tamsulosin HCl (FLOMAX) 0.4 MG CAPS Take 0.4 mg by mouth daily.        Marland Kitchen oxyCODONE-acetaminophen (PERCOCET) 5-325 MG per tablet Take 1 tablet by mouth every 8 (eight) hours as needed.        Marland Kitchen DISCONTD: esomeprazole (NEXIUM) 40 MG capsule Take 40 mg by mouth daily before breakfast.           Review of Systems  Constitutional: Negative.   HENT: Negative.   Eyes: Negative.   Respiratory: Negative.   Cardiovascular: Negative.   Gastrointestinal: Negative.   Musculoskeletal: Positive for back pain, joint swelling and arthralgias.  Skin: Negative.   Neurological: Negative.   Hematological: Negative.   Psychiatric/Behavioral: Negative.   All other systems reviewed  and are negative.    BP 145/74  Pulse 56  Ht 5\' 10"  (1.778 m)  Wt 206 lb (93.441 kg)  BMI 29.56 kg/m2  Physical Exam  Nursing note and vitals reviewed. Constitutional: He is oriented to person, place, and time. He appears well-developed and well-nourished.  HENT:  Head: Normocephalic.  Nose: Nose normal.  Mouth/Throat: Oropharynx is clear and moist.  Eyes: Conjunctivae are normal. Pupils are equal, round, and reactive to light.  Neck: Normal range of motion. Neck supple. No JVD present.  Cardiovascular: Normal rate, regular rhythm, S1 normal, S2 normal, normal heart sounds and intact distal pulses.  Exam reveals no gallop and no friction rub.   No murmur heard. Pulmonary/Chest: Effort normal and breath sounds normal. No respiratory distress. He has no wheezes. He has no rales. He exhibits no tenderness.  Abdominal: Soft. Bowel sounds  are normal. He exhibits no distension. There is no tenderness.  Musculoskeletal: Normal range of motion. He exhibits no edema and no tenderness.  Lymphadenopathy:    He has no cervical adenopathy.  Neurological: He is alert and oriented to person, place, and time. Coordination normal.  Skin: Skin is warm and dry. No rash noted. No erythema.  Psychiatric: He has a normal mood and affect. His behavior is normal. Judgment and thought content normal.           Assessment and Plan

## 2011-07-25 NOTE — Assessment & Plan Note (Signed)
Diet-controlled diabetes. We have encouraged continued exercise, careful diet management in an effort to lose weight.

## 2011-07-25 NOTE — Assessment & Plan Note (Signed)
No recent episodes of chest pain. No further workup ordered at this time.

## 2011-07-25 NOTE — Assessment & Plan Note (Signed)
Initial blood pressure was elevated, this did improve on recheck the systolic pressure in the low 140s. Fasting to monitor his blood pressure at home and contact us or systolic pressures greater than 140 on a regular basis.

## 2011-07-25 NOTE — Assessment & Plan Note (Signed)
We have suggested that he continue simvastatin 40 mg daily. Goal LDL less than 70.

## 2011-07-30 DIAGNOSIS — R200 Anesthesia of skin: Secondary | ICD-10-CM | POA: Insufficient documentation

## 2011-09-16 ENCOUNTER — Ambulatory Visit: Payer: Self-pay

## 2011-10-23 HISTORY — PX: CARPAL TUNNEL RELEASE: SHX101

## 2011-11-12 HISTORY — PX: MELANOMA EXCISION: SHX5266

## 2011-11-12 HISTORY — PX: CATARACT EXTRACTION, BILATERAL: SHX1313

## 2012-04-02 ENCOUNTER — Other Ambulatory Visit: Payer: Self-pay | Admitting: Neurosurgery

## 2012-04-02 DIAGNOSIS — M4712 Other spondylosis with myelopathy, cervical region: Secondary | ICD-10-CM

## 2012-04-08 ENCOUNTER — Ambulatory Visit
Admission: RE | Admit: 2012-04-08 | Discharge: 2012-04-08 | Disposition: A | Discharge status: Home / Self Care | Payer: Medicare Other | Source: Ambulatory Visit | Attending: Neurosurgery | Admitting: Neurosurgery

## 2012-04-08 DIAGNOSIS — M4712 Other spondylosis with myelopathy, cervical region: Secondary | ICD-10-CM

## 2012-07-27 ENCOUNTER — Encounter: Payer: Self-pay | Admitting: Cardiovascular Disease

## 2012-07-27 ENCOUNTER — Ambulatory Visit (INDEPENDENT_AMBULATORY_CARE_PROVIDER_SITE_OTHER): Payer: Medicare Other | Admitting: Cardiovascular Disease

## 2012-07-27 VITALS — BP 150/60 | HR 66 | Ht 69.0 in | Wt 203.5 lb

## 2012-07-27 DIAGNOSIS — R0602 Shortness of breath: Secondary | ICD-10-CM

## 2012-07-27 DIAGNOSIS — I10 Essential (primary) hypertension: Secondary | ICD-10-CM

## 2012-07-27 DIAGNOSIS — G894 Chronic pain syndrome: Secondary | ICD-10-CM | POA: Insufficient documentation

## 2012-07-27 DIAGNOSIS — E785 Hyperlipidemia, unspecified: Secondary | ICD-10-CM

## 2012-07-27 DIAGNOSIS — R079 Chest pain, unspecified: Secondary | ICD-10-CM

## 2012-07-27 NOTE — Progress Notes (Signed)
Patient ID: Bob Christensen, male    DOB: 1941-06-11, 71 y.o.   MRN: [id number]  HPI Comments: Bob Christensen is a very pleasant 71 year old gentleman with a history of atrial fibrillation back in 1997, hyperlipidemia, hypertension, diabetes type 2,  elevated PSA and symptoms of erectile dysfunction and lethargy, previous episodes of chest pain,  shortness of breath, who presents for routine followup.   He reports that overall he is doing well. He is very sleepy much of the day. His biggest concern is the pain in his hands and to a more mild extent, his feet. He has difficulty sleeping at night time secondary to the tingling and pain and tightness in his hands. This has been going on for some time.  He does have left hip and knee pain as well. He has followup with Trey Sailors for his back  He denies any significant chest pain, shortness of breath and is otherwise feeling well. He is unable to walk very far secondary to back pain and he gives out. Some shortness of breath with exertion. He sweats a lot if he does heavy work outside.    history of left hip replacement. He stopped smoking in 1989. He reports having a cardiac catheterization when he had his atrial fibrillation in 1997 and reports this was normal showing no significant disease.   EKG shows normal sinus rhythm with rate of 66 beats per minute, no significant ST or T wave changes    Outpatient Encounter Prescriptions as of 07/27/2012  Medication Sig Dispense Refill  . allopurinol (ZYLOPRIM) 100 MG tablet Take 100 mg by mouth 2 (two) times daily.        Marland Kitchen aspirin 81 MG EC tablet Take 81 mg by mouth daily.        Marland Kitchen diltiazem (CARDIZEM CD) 240 MG 24 hr capsule Take 240 mg by mouth daily.        . finasteride (PROSCAR) 5 MG tablet Take 5 mg by mouth daily.        Marland Kitchen gabapentin (NEURONTIN) 600 MG tablet Take 600 mg by mouth 3 (three) times daily.        Marland Kitchen GLUCOSAMINE-CHONDROITIN-VIT C PO Take 1 capsule by mouth daily.        Marland Kitchen  lisinopril-hydrochlorothiazide (PRINZIDE,ZESTORETIC) 20-12.5 MG per tablet Take 0.5 tablets by mouth daily.       . Multiple Vitamins-Minerals (CENTRUM SILVER PO) Take 1 tablet by mouth daily.        . nitroGLYCERIN (NITROSTAT) 0.4 MG SL tablet Place 0.4 mg under the tongue every 5 (five) minutes as needed.        Marland Kitchen oxyCODONE-acetaminophen (PERCOCET) 7.5-325 MG per tablet Take 1 tablet by mouth every 4 (four) hours as needed.      . simvastatin (ZOCOR) 40 MG tablet Take 40 mg by mouth at bedtime.        . Tamsulosin HCl (FLOMAX) 0.4 MG CAPS Take 0.4 mg by mouth daily.        Marland Kitchen DISCONTD: oxyCODONE-acetaminophen (PERCOCET) 5-325 MG per tablet Take 1 tablet by mouth every 8 (eight) hours as needed.           Review of Systems  Constitutional: Negative.   HENT: Negative.   Eyes: Negative.   Respiratory: Negative.   Cardiovascular: Negative.   Gastrointestinal: Negative.   Musculoskeletal: Positive for back pain, joint swelling and arthralgias.  Skin: Negative.   Neurological: Negative.   Hematological: Negative.   Psychiatric/Behavioral: Negative.   All other  systems reviewed and are negative.    BP 150/60  Pulse 66  Ht 5\' 9"  (1.753 m)  Wt 203 lb 8 oz (92.307 kg)  BMI 30.05 kg/m2  Physical Exam  Nursing note and vitals reviewed. Constitutional: He is oriented to person, place, and time. He appears well-developed and well-nourished.  HENT:  Head: Normocephalic.  Nose: Nose normal.  Mouth/Throat: Oropharynx is clear and moist.  Eyes: Conjunctivae normal are normal. Pupils are equal, round, and reactive to light.  Neck: Normal range of motion. Neck supple. No JVD present.  Cardiovascular: Normal rate, regular rhythm, S1 normal, S2 normal, normal heart sounds and intact distal pulses.  Exam reveals no gallop and no friction rub.   No murmur heard. Pulmonary/Chest: Effort normal and breath sounds normal. No respiratory distress. He has no wheezes. He has no rales. He exhibits no  tenderness.  Abdominal: Soft. Bowel sounds are normal. He exhibits no distension. There is no tenderness.  Musculoskeletal: Normal range of motion. He exhibits no edema and no tenderness.  Lymphadenopathy:    He has no cervical adenopathy.  Neurological: He is alert and oriented to person, place, and time. Coordination normal.  Skin: Skin is warm and dry. No rash noted. No erythema.  Psychiatric: He has a normal mood and affect. His behavior is normal. Judgment and thought content normal.           Assessment and Plan

## 2012-07-27 NOTE — Assessment & Plan Note (Signed)
We have suggested he stay on his statin 

## 2012-07-27 NOTE — Patient Instructions (Addendum)
You are doing well. No medication changes were made.  Please call the office if you have any chest tightness, worsening shortness of breath  Please call us if you have new issues that need to be addressed before your next appt.  Your physician wants you to follow-up in: 12 months.  You will receive a reminder letter in the mail two months in advance. If you don't receive a letter, please call our office to schedule the follow-up appointment.

## 2012-07-27 NOTE — Assessment & Plan Note (Signed)
Back pain in the upper and lower back, leg pain, hand pain. He is limited by these. Conditions not very active, not doing regular exercise.

## 2012-07-27 NOTE — Assessment & Plan Note (Signed)
Blood pressure was improved on recheck with systolic pressure in the 120 range. No changes made to his medications.

## 2012-09-11 DIAGNOSIS — R972 Elevated prostate specific antigen [PSA]: Secondary | ICD-10-CM | POA: Insufficient documentation

## 2012-10-11 LAB — HM DIABETES EYE EXAM

## 2012-12-31 ENCOUNTER — Other Ambulatory Visit: Payer: Self-pay | Admitting: Neurosurgery

## 2012-12-31 DIAGNOSIS — M542 Cervicalgia: Secondary | ICD-10-CM

## 2012-12-31 DIAGNOSIS — M47812 Spondylosis without myelopathy or radiculopathy, cervical region: Secondary | ICD-10-CM

## 2013-01-04 ENCOUNTER — Ambulatory Visit
Admission: RE | Admit: 2013-01-04 | Discharge: 2013-01-04 | Disposition: A | Discharge status: Home / Self Care | Payer: Medicare Other | Source: Ambulatory Visit | Attending: Neurosurgery | Admitting: Neurosurgery

## 2013-01-04 DIAGNOSIS — M47812 Spondylosis without myelopathy or radiculopathy, cervical region: Secondary | ICD-10-CM

## 2013-01-04 DIAGNOSIS — M542 Cervicalgia: Secondary | ICD-10-CM

## 2013-06-01 ENCOUNTER — Telehealth: Payer: Self-pay

## 2013-06-01 NOTE — Telephone Encounter (Signed)
Pt called into office reports feeling bad, wanting to sleep all the time x 3-4 weeks.  Pt reports breaking out into sweat with minimal activity.  Pt reports a little chest tightness today denies SOB. BP has been running 118/40 at MD office, BP at [facility] 130/50 yesterday.  Pt states he slacked off on taking Lisinopril/HCT x 2-3 days to see if BP would come up some. Advised pt BP readings reported look OK, do not hold lisinopril/HCT unless instructed by MD.  Pt denies acute CP at present, no N/V, no SOB.  Pt is due for 12 mo f/u with Dr Mariah Milling in 9/14.  Made OV for 06/02/13 at 10:45 with Dr Mariah Milling.  Pt aware of appt date and time.  Pt also advised if develops worsening CP to call EMS/ go to ED.  Pt verbalizes understanding.

## 2013-06-02 ENCOUNTER — Ambulatory Visit (INDEPENDENT_AMBULATORY_CARE_PROVIDER_SITE_OTHER): Payer: Medicare Other | Admitting: Cardiovascular Disease

## 2013-06-02 ENCOUNTER — Encounter: Payer: Self-pay | Admitting: Cardiovascular Disease

## 2013-06-02 VITALS — BP 124/62 | HR 59 | Ht 70.0 in | Wt 202.5 lb

## 2013-06-02 DIAGNOSIS — R0602 Shortness of breath: Secondary | ICD-10-CM

## 2013-06-02 DIAGNOSIS — R5383 Other fatigue: Secondary | ICD-10-CM

## 2013-06-02 DIAGNOSIS — I10 Essential (primary) hypertension: Secondary | ICD-10-CM

## 2013-06-02 DIAGNOSIS — R079 Chest pain, unspecified: Secondary | ICD-10-CM

## 2013-06-02 DIAGNOSIS — E119 Type 2 diabetes mellitus without complications: Secondary | ICD-10-CM

## 2013-06-02 DIAGNOSIS — E785 Hyperlipidemia, unspecified: Secondary | ICD-10-CM

## 2013-06-02 DIAGNOSIS — G894 Chronic pain syndrome: Secondary | ICD-10-CM

## 2013-06-02 NOTE — Assessment & Plan Note (Signed)
He has had prior episodes of chest pain. No known coronary artery disease. Chest pain does not seem to be associated with exertion. Some atypical features. We did spend a lot of time talking about stress testing. He will call us if his symptoms get worse.

## 2013-06-02 NOTE — Assessment & Plan Note (Signed)
We have encouraged continued low grade exercise, careful diet management in an effort to lose weight.

## 2013-06-02 NOTE — Assessment & Plan Note (Signed)
He is on pain medication for chronic neck, hip, knee pain. Sedentary secondary to his pain.

## 2013-06-02 NOTE — Assessment & Plan Note (Signed)
No recent lipids available. We'll try to obtain this from Dr. Sherrie Mustache.

## 2013-06-02 NOTE — Patient Instructions (Addendum)
You are doing well. Try a lisinopril hct 1/2 pill daily  Monitor your blood pressure  Call the office if you start having more chest pain and shortness of breath We will order a stress test  Please call us if you have new issues that need to be addressed before your next appt.  Your physician wants you to follow-up in: 6 months.  You will receive a reminder letter in the mail two months in advance. If you don't receive a letter, please call our office to schedule the follow-up appointment.

## 2013-06-02 NOTE — Assessment & Plan Note (Addendum)
Blood pressure is well controlled. I suggested he would be okay to cut his lisinopril HCT in half and take his daily. He misses several days a week as it is. He will monitor his blood pressure at home.

## 2013-06-02 NOTE — Progress Notes (Signed)
Patient ID: Bob Christensen, male    DOB: [birth year], 72 y.o.   MRN: [id number]  HPI Comments: Mr. Gains is a very pleasant 72 year old gentleman with a history of atrial fibrillation back in 1997, hyperlipidemia, hypertension, diabetes type 2,  elevated PSA and symptoms of erectile dysfunction and lethargy, previous episodes of chest pain,  shortness of breath, who presents for routine followup.   On his previous clinic visit, he reported being very sleepy On today's visit, he continues to have significant fatigue. He does work in his garden and his yard. He sleeps at 7 PM until 6 AM. Very rare chest tightness, mild shortness of breath which is chronic.  His biggest complaint is chronic neck pain. Also with severe arm numbness and weakness in his hand grip strength. He has followup with Trey Sailors for his back  He is concerned that he might need surgery again and he is seen a surgeon at [facility] for second opinion He has been missing doses of his lisinopril here and there and would like to cut the pill in half and take this on a regular basis and take this on a regular basis. He does not check his blood pressure at home.  He is unable to walk very far secondary to back and neck pain and he gives out.  He sweats a lot if he does heavy work outside.    history of left hip replacement. He stopped smoking in 1989. He reports having a cardiac catheterization when he had his atrial fibrillation in 1997 and reports this was normal showing no significant disease.   EKG shows normal sinus rhythm with rate of 59 beats per minute, no significant ST or T wave changes    Outpatient Encounter Prescriptions as of 72/23/2014  Medication Sig Dispense Refill  . allopurinol (ZYLOPRIM) 100 MG tablet Take 100 mg by mouth 2 (two) times daily.        Marland Kitchen aspirin 81 MG EC tablet Take 81 mg by mouth daily.        Marland Kitchen diltiazem (CARDIZEM CD) 240 MG 24 hr capsule Take 240 mg by mouth daily.        . finasteride (PROSCAR) 5  MG tablet Take 5 mg by mouth daily.        Marland Kitchen gabapentin (NEURONTIN) 600 MG tablet Take 600 mg by mouth 3 (three) times daily.        Marland Kitchen GLUCOSAMINE-CHONDROITIN-VIT C PO Take 1 capsule by mouth daily.        Marland Kitchen lisinopril-hydrochlorothiazide (PRINZIDE,ZESTORETIC) 20-12.5 MG per tablet Take 1 tablet by mouth daily.       . Multiple Vitamins-Minerals (CENTRUM SILVER PO) Take 1 tablet by mouth daily.        . nitroGLYCERIN (NITROSTAT) 0.4 MG SL tablet Place 0.4 mg under the tongue every 5 (five) minutes as needed.        Marland Kitchen oxyCODONE-acetaminophen (PERCOCET) 7.5-325 MG per tablet Take 1 tablet by mouth every 4 (four) hours as needed.      . simvastatin (ZOCOR) 40 MG tablet Take 40 mg by mouth at bedtime.        . Tamsulosin HCl (FLOMAX) 0.4 MG CAPS Take 0.4 mg by mouth daily.           Review of Systems  Constitutional: Positive for fatigue.  HENT: Negative.   Eyes: Negative.   Respiratory: Positive for shortness of breath.   Cardiovascular: Positive for chest pain.  Gastrointestinal: Negative.   Musculoskeletal: Positive for  back pain, joint swelling and arthralgias.  Skin: Negative.   Neurological: Positive for weakness.  Psychiatric/Behavioral: Negative.   All other systems reviewed and are negative.    BP 124/62  Pulse 59  Ht 5\' 10"  (1.778 m)  Wt 202 lb 8 oz (91.853 kg)  BMI 29.06 kg/m2  Physical Exam  Nursing note and vitals reviewed. Constitutional: He is oriented to person, place, and time. He appears well-developed and well-nourished.  HENT:  Head: Normocephalic.  Nose: Nose normal.  Mouth/Throat: Oropharynx is clear and moist.  Eyes: Conjunctivae are normal. Pupils are equal, round, and reactive to light.  Neck: Normal range of motion. Neck supple. No JVD present.  Cardiovascular: Normal rate, regular rhythm, S1 normal, S2 normal, normal heart sounds and intact distal pulses.  Exam reveals no gallop and no friction rub.   No murmur heard. Pulmonary/Chest: Effort normal  and breath sounds normal. No respiratory distress. He has no wheezes. He has no rales. He exhibits no tenderness.  Abdominal: Soft. Bowel sounds are normal. He exhibits no distension. There is no tenderness.  Musculoskeletal: Normal range of motion. He exhibits no edema and no tenderness.  Lymphadenopathy:    He has no cervical adenopathy.  Neurological: He is alert and oriented to person, place, and time. Coordination normal.  Skin: Skin is warm and dry. No rash noted. No erythema.  Psychiatric: He has a normal mood and affect. His behavior is normal. Judgment and thought content normal.      Assessment and Plan

## 2013-06-02 NOTE — Assessment & Plan Note (Signed)
Notes indicate he had fatigue on his last clinic visit as well. Uncertain if this is from chronic pain, unable to exclude depression secondary to his chronic pain. Seems to sleep well. Is not very active at baseline. Could be poor conditioning as he is limited by his chronic pain in his neck, hip, knee.

## 2013-09-24 LAB — HEMOGLOBIN A1C, FINGERSTICK: Hemoglobin-A1c: 6.5

## 2013-11-11 DIAGNOSIS — S46219A Strain of muscle, fascia and tendon of other parts of biceps, unspecified arm, initial encounter: Secondary | ICD-10-CM

## 2013-11-11 HISTORY — DX: Strain of muscle, fascia and tendon of other parts of biceps, unspecified arm, initial encounter: S46.219A

## 2013-12-16 ENCOUNTER — Ambulatory Visit (INDEPENDENT_AMBULATORY_CARE_PROVIDER_SITE_OTHER): Payer: Medicare Other | Admitting: Cardiovascular Disease

## 2013-12-16 ENCOUNTER — Encounter: Payer: Self-pay | Admitting: Cardiovascular Disease

## 2013-12-16 VITALS — BP 133/67 | HR 62 | Ht 68.0 in | Wt 199.2 lb

## 2013-12-16 DIAGNOSIS — E785 Hyperlipidemia, unspecified: Secondary | ICD-10-CM

## 2013-12-16 DIAGNOSIS — E119 Type 2 diabetes mellitus without complications: Secondary | ICD-10-CM

## 2013-12-16 DIAGNOSIS — I4891 Unspecified atrial fibrillation: Secondary | ICD-10-CM

## 2013-12-16 DIAGNOSIS — I10 Essential (primary) hypertension: Secondary | ICD-10-CM

## 2013-12-16 DIAGNOSIS — R0789 Other chest pain: Secondary | ICD-10-CM

## 2013-12-16 DIAGNOSIS — G894 Chronic pain syndrome: Secondary | ICD-10-CM

## 2013-12-16 NOTE — Progress Notes (Signed)
Patient ID: Bob Christensen, male    DOB: 1941-03-19, 73 y.o.   MRN: [id number]  HPI Comments: Bob Christensen is a very pleasant 73 year old gentleman with a history of atrial fibrillation back in 1997, hyperlipidemia, hypertension, diabetes type 2,  elevated PSA and symptoms of erectile dysfunction and lethargy, previous episodes of chest pain,  shortness of breath, who presents for routine followup.   On previous visits, fatigue has been a major complaint. Chronic neck, bilateral arm weakness, radiating pain down his arms to his hands continues to be issue. Previously evaluated by neurosurgery and is uncertain how to proceed with possible surgery, anterior approach, posterior approach, replacement of the plate in his neck with a larger plate He denies any significant new cardiac issues. Shortness of breath, no chest pain. Maintaining normal sinus rhythm He has difficulty affording the Cardizem which caused him $100 every 3 months He is unable to walk very far secondary to back and neck pain and he gives out.   history of left hip replacement. He stopped smoking in 1989. He reports having a cardiac catheterization when he had his atrial fibrillation in 1997 and reports this was normal showing no significant disease.   EKG shows normal sinus rhythm with rate of 62 beats per minute, no significant ST or T wave changes    Outpatient Encounter Prescriptions as of 12/16/2013  Medication Sig  . allopurinol (ZYLOPRIM) 100 MG tablet Take 100 mg by mouth 2 (two) times daily.    Marland Kitchen aspirin 81 MG EC tablet Take 81 mg by mouth daily.    Marland Kitchen diltiazem (CARDIZEM CD) 240 MG 24 hr capsule Take 240 mg by mouth daily.    . finasteride (PROSCAR) 5 MG tablet Take 5 mg by mouth daily.    Marland Kitchen gabapentin (NEURONTIN) 600 MG tablet Take 600 mg by mouth 3 (three) times daily.    Marland Kitchen GLUCOSAMINE-CHONDROITIN-VIT C PO Take 1 capsule by mouth daily.    Marland Kitchen lisinopril-hydrochlorothiazide (PRINZIDE,ZESTORETIC) 20-12.5 MG per tablet  Take 1 tablet by mouth daily.   . Multiple Vitamins-Minerals (CENTRUM SILVER PO) Take 1 tablet by mouth daily.    . nitroGLYCERIN (NITROSTAT) 0.4 MG SL tablet Place 0.4 mg under the tongue every 5 (five) minutes as needed.    Marland Kitchen oxyCODONE-acetaminophen (PERCOCET) 7.5-325 MG per tablet Take 1 tablet by mouth every 4 (four) hours as needed.  . simvastatin (ZOCOR) 40 MG tablet Take 40 mg by mouth at bedtime.    . Tamsulosin HCl (FLOMAX) 0.4 MG CAPS Take 0.4 mg by mouth daily.        Review of Systems  Constitutional: Positive for fatigue.  HENT: Negative.   Eyes: Negative.   Respiratory: Negative.   Cardiovascular: Negative.   Gastrointestinal: Negative.   Endocrine: Negative.   Musculoskeletal: Positive for arthralgias, back pain and joint swelling.  Skin: Negative.   Allergic/Immunologic: Negative.   Neurological: Positive for weakness.  Hematological: Negative.   Psychiatric/Behavioral: Negative.   All other systems reviewed and are negative.    BP 133/67  Pulse 62  Ht 5\' 8"  (1.727 m)  Wt 199 lb 4 oz (90.379 kg)  BMI 30.30 kg/m2  Physical Exam  Nursing note and vitals reviewed. Constitutional: He is oriented to person, place, and time. He appears well-developed and well-nourished.  HENT:  Head: Normocephalic.  Nose: Nose normal.  Mouth/Throat: Oropharynx is clear and moist.  Eyes: Conjunctivae are normal. Pupils are equal, round, and reactive to light.  Neck: Normal range of motion. Neck  supple. No JVD present.  Cardiovascular: Normal rate, regular rhythm, S1 normal, S2 normal, normal heart sounds and intact distal pulses.  Exam reveals no gallop and no friction rub.   No murmur heard. Pulmonary/Chest: Effort normal and breath sounds normal. No respiratory distress. He has no wheezes. He has no rales. He exhibits no tenderness.  Abdominal: Soft. Bowel sounds are normal. He exhibits no distension. There is no tenderness.  Musculoskeletal: Normal range of motion. He  exhibits no edema and no tenderness.  Lymphadenopathy:    He has no cervical adenopathy.  Neurological: He is alert and oriented to person, place, and time. Coordination normal.  Skin: Skin is warm and dry. No rash noted. No erythema.  Psychiatric: He has a normal mood and affect. His behavior is normal. Judgment and thought content normal.      Assessment and Plan

## 2013-12-16 NOTE — Assessment & Plan Note (Signed)
We have encouraged continued exercise, careful diet management in an effort to lose weight. 

## 2013-12-16 NOTE — Assessment & Plan Note (Signed)
We'll try to obtain the most recent lipid panel for our records

## 2013-12-16 NOTE — Assessment & Plan Note (Signed)
Blood pressure is well controlled on today's visit. No changes made to the medications. We did discuss changing his Cardizem to generic diltiazem 60 mg 4 times a day, even possibly 90 mg 3 times a day He would like to think about this and will call us if he would like to make a change. Could also change to verapamil if cheaper for him.

## 2013-12-16 NOTE — Assessment & Plan Note (Signed)
Remote history of atrial fibrillation. No recent arrhythmia noted. Continue current medications

## 2013-12-16 NOTE — Patient Instructions (Addendum)
You are doing well. No medication changes were made.  Options for changing the diltiazem ER/cardizem: :  : diltiazem three times a day (generic) Or verapamil three times a day  Please call us if you have new issues that need to be addressed before your next appt.  Your physician wants you to follow-up in: 6 months.  You will receive a reminder letter in the mail two months in advance. If you don't receive a letter, please call our office to schedule the follow-up appointment.

## 2013-12-16 NOTE — Assessment & Plan Note (Signed)
Chronic neck, arm, hand pain, arthritis, radiating nerve pain into his hands. He takes Neurontin 3 times a day

## 2013-12-27 ENCOUNTER — Other Ambulatory Visit: Payer: Self-pay | Admitting: Neurosurgery

## 2013-12-27 DIAGNOSIS — M4712 Other spondylosis with myelopathy, cervical region: Secondary | ICD-10-CM

## 2013-12-29 ENCOUNTER — Other Ambulatory Visit: Payer: Self-pay | Admitting: Neurosurgery

## 2013-12-29 DIAGNOSIS — M4712 Other spondylosis with myelopathy, cervical region: Secondary | ICD-10-CM

## 2014-01-07 ENCOUNTER — Ambulatory Visit
Admission: RE | Admit: 2014-01-07 | Discharge: 2014-01-07 | Disposition: A | Discharge status: Home / Self Care | Payer: Medicare Other | Source: Ambulatory Visit | Attending: Neurosurgery | Admitting: Neurosurgery

## 2014-01-07 ENCOUNTER — Other Ambulatory Visit: Payer: Self-pay | Admitting: Neurosurgery

## 2014-01-07 DIAGNOSIS — M4712 Other spondylosis with myelopathy, cervical region: Secondary | ICD-10-CM

## 2014-05-12 ENCOUNTER — Other Ambulatory Visit: Payer: Self-pay | Admitting: Neurosurgery

## 2014-05-12 DIAGNOSIS — M47816 Spondylosis without myelopathy or radiculopathy, lumbar region: Secondary | ICD-10-CM

## 2014-05-22 ENCOUNTER — Other Ambulatory Visit: Payer: No Typology Code available for payment source

## 2014-05-23 ENCOUNTER — Ambulatory Visit
Admission: RE | Admit: 2014-05-23 | Discharge: 2014-05-23 | Disposition: A | Discharge status: Home / Self Care | Payer: Medicare Other | Source: Ambulatory Visit | Attending: Neurosurgery | Admitting: Neurosurgery

## 2014-05-23 DIAGNOSIS — M47816 Spondylosis without myelopathy or radiculopathy, lumbar region: Secondary | ICD-10-CM

## 2014-06-27 ENCOUNTER — Ambulatory Visit (INDEPENDENT_AMBULATORY_CARE_PROVIDER_SITE_OTHER): Payer: Medicare Other | Admitting: Cardiovascular Disease

## 2014-06-27 ENCOUNTER — Encounter: Payer: Self-pay | Admitting: Cardiovascular Disease

## 2014-06-27 VITALS — BP 128/62 | HR 55 | Ht 69.0 in | Wt 197.2 lb

## 2014-06-27 DIAGNOSIS — E119 Type 2 diabetes mellitus without complications: Secondary | ICD-10-CM

## 2014-06-27 DIAGNOSIS — I10 Essential (primary) hypertension: Secondary | ICD-10-CM

## 2014-06-27 DIAGNOSIS — E785 Hyperlipidemia, unspecified: Secondary | ICD-10-CM

## 2014-06-27 DIAGNOSIS — I4891 Unspecified atrial fibrillation: Secondary | ICD-10-CM

## 2014-06-27 DIAGNOSIS — R0989 Other specified symptoms and signs involving the circulatory and respiratory systems: Secondary | ICD-10-CM

## 2014-06-27 DIAGNOSIS — G894 Chronic pain syndrome: Secondary | ICD-10-CM

## 2014-06-27 MED ORDER — DILTIAZEM HCL 60 MG PO TABS: 60.0000 mg | ORAL_TABLET | Freq: Three times a day (TID) | ORAL | Status: DC

## 2014-06-27 NOTE — Patient Instructions (Addendum)
You are doing well. When you run out of the diltiazem CR 240 mg one a day pill Change to diltiazem 60 mg three times a day  We will schedule a carotid ultrasound  Please call us if you have new issues that need to be addressed before your next appt.  Your physician wants you to follow-up in: 6 months.  You will receive a reminder letter in the mail two months in advance. If you don't receive a letter, please call our office to schedule the follow-up appointment.

## 2014-06-27 NOTE — Progress Notes (Signed)
Patient ID: Bob Christensen, male    DOB: 02-22-1941, 73 y.o.   MRN: [id number]  HPI Comments: Bob Christensen is a very pleasant 73 year old gentleman with a history of atrial fibrillation back in 1997, hyperlipidemia, hypertension, diabetes type 2,  elevated PSA and symptoms of erectile dysfunction and lethargy, previous episodes of chest pain,  shortness of breath, who presents for routine followup.   In followup today, he denies any shortness of breath, chest pain, leg edema. He is limited in his activity secondary to cervical and lumbar back pain  Maintaining normal sinus rhythm He has difficulty affording the Cardizem which caused him $100 every 3 months He is unable to walk very far  He continues to have weakness, radiating pain down his arms to his hands  He is seeing Dr. Sabra Heck of orthopedics who has recommended carpal tunnel surgery Previously evaluated by neurosurgery. He has a plate in his neck    history of left hip replacement. He stopped smoking in 1989. He reports having a cardiac catheterization when he had his atrial fibrillation in 1997 and reports this was normal showing no significant disease.   EKG shows normal sinus rhythm with rate of 55 beats per minute, no significant ST or T wave changes    Outpatient Encounter Prescriptions as of 06/27/2014  Medication Sig  . allopurinol (ZYLOPRIM) 100 MG tablet Take 100 mg by mouth 2 (two) times daily.    Marland Kitchen aspirin 81 MG EC tablet Take 81 mg by mouth daily.    Marland Kitchen diltiazem (CARDIZEM CD) 240 MG 24 hr capsule Take 240 mg by mouth daily.    . finasteride (PROSCAR) 5 MG tablet Take 5 mg by mouth daily.    Marland Kitchen gabapentin (NEURONTIN) 600 MG tablet Take 600 mg by mouth 3 (three) times daily.    Marland Kitchen GLUCOSAMINE-CHONDROITIN-VIT C PO Take 1 capsule by mouth daily.    Marland Kitchen lisinopril-hydrochlorothiazide (PRINZIDE,ZESTORETIC) 20-12.5 MG per tablet Take 1 tablet by mouth daily.   . Multiple Vitamins-Minerals (CENTRUM SILVER PO) Take 1 tablet by  mouth daily.    . nitroGLYCERIN (NITROSTAT) 0.4 MG SL tablet Place 0.4 mg under the tongue every 5 (five) minutes as needed.    Marland Kitchen oxyCODONE-acetaminophen (PERCOCET) 7.5-325 MG per tablet Take 1 tablet by mouth every 4 (four) hours as needed.  . simvastatin (ZOCOR) 40 MG tablet Take 40 mg by mouth at bedtime.    . Tamsulosin HCl (FLOMAX) 0.4 MG CAPS Take 0.4 mg by mouth daily.       Review of Systems  Constitutional: Positive for fatigue.  HENT: Negative.   Eyes: Negative.   Respiratory: Negative.   Cardiovascular: Negative.   Gastrointestinal: Negative.   Endocrine: Negative.   Musculoskeletal: Positive for arthralgias, back pain and joint swelling.  Skin: Negative.   Allergic/Immunologic: Negative.   Neurological: Positive for weakness.  Hematological: Negative.   Psychiatric/Behavioral: Negative.   All other systems reviewed and are negative.   BP 128/62  Pulse 55  Ht 5\' 9"  (1.753 m)  Wt 197 lb 4 oz (89.472 kg)  BMI 29.12 kg/m2  Physical Exam  Nursing note and vitals reviewed. Constitutional: He is oriented to person, place, and time. He appears well-developed and well-nourished.  HENT:  Head: Normocephalic.  Nose: Nose normal.  Mouth/Throat: Oropharynx is clear and moist.  Eyes: Conjunctivae are normal. Pupils are equal, round, and reactive to light.  Neck: Normal range of motion. Neck supple. No JVD present. Carotid bruit is present.  Cardiovascular: Normal rate,  regular rhythm, S1 normal, S2 normal, normal heart sounds and intact distal pulses.  Exam reveals no gallop and no friction rub.   No murmur heard. Pulmonary/Chest: Effort normal and breath sounds normal. No respiratory distress. He has no wheezes. He has no rales. He exhibits no tenderness.  Abdominal: Soft. Bowel sounds are normal. He exhibits no distension. There is no tenderness.  Musculoskeletal: Normal range of motion. He exhibits no edema and no tenderness.  Lymphadenopathy:    He has no cervical  adenopathy.  Neurological: He is alert and oriented to person, place, and time. Coordination normal.  Skin: Skin is warm and dry. No rash noted. No erythema.  Psychiatric: He has a normal mood and affect. His behavior is normal. Judgment and thought content normal.      Assessment and Plan

## 2014-06-27 NOTE — Assessment & Plan Note (Signed)
Remote history of atrial fibrillation. No recent arrhythmia noted. Blood pressure and heart rate running low by his measurements at home. We will change his diltiazem to 60 mg 3 times a day. He is unable to afford the extended-release Cardizem

## 2014-06-27 NOTE — Assessment & Plan Note (Signed)
We have encouraged continued exercise, careful diet management in an effort to lose weight. 

## 2014-06-27 NOTE — Assessment & Plan Note (Signed)
Continues to have severe pain in his hands, arms, cervical spine, lumbar spine

## 2014-06-27 NOTE — Assessment & Plan Note (Signed)
Cholesterol is at goal on the current lipid regimen. No changes to the medications were made.  

## 2014-06-27 NOTE — Assessment & Plan Note (Signed)
Medication change as above. He is concerned about his diastolic pressure which is running low at home

## 2014-07-12 ENCOUNTER — Encounter (INDEPENDENT_AMBULATORY_CARE_PROVIDER_SITE_OTHER): Payer: Medicare Other

## 2014-07-12 DIAGNOSIS — R0989 Other specified symptoms and signs involving the circulatory and respiratory systems: Secondary | ICD-10-CM

## 2014-08-02 LAB — BASIC METABOLIC PANEL
BUN: 29 mg/dL — AB (ref 4–21)
Creatinine: 1.3 mg/dL (ref 0.6–1.3)
Glucose: 118 mg/dL
POTASSIUM: 4.5 mmol/L (ref 3.4–5.3)
Sodium: 146 mmol/L (ref 137–147)

## 2014-08-02 LAB — LIPID PANEL
CK TOTAL: 66 U/L (ref ?–195.0)
Cholesterol: 120 mg/dL (ref 0–200)
HDL: 34 mg/dL — AB (ref 35–70)
LDL Cholesterol: 68 mg/dL
TRIGLYCERIDES: 90 mg/dL (ref 40–160)

## 2014-08-02 LAB — TSH: TSH: 1.63 u[IU]/mL (ref 0.41–5.90)

## 2014-08-02 LAB — HEPATIC FUNCTION PANEL: ALT: 16 U/L (ref 10–40)

## 2014-08-02 LAB — CBC AND DIFFERENTIAL
HCT: 36 % — AB (ref 41–53)
Hemoglobin: 12.2 g/dL — AB (ref 13.5–17.5)
Platelets: 160 10*3/uL (ref 150–399)
WBC: 8.9 10*3/mL

## 2014-10-21 ENCOUNTER — Ambulatory Visit: Payer: Self-pay | Admitting: Unknown Physician Specialty

## 2014-10-21 LAB — HM COLONOSCOPY

## 2014-12-02 ENCOUNTER — Emergency Department: Payer: Self-pay | Admitting: Internal Medicine

## 2014-12-05 ENCOUNTER — Emergency Department: Payer: Self-pay | Admitting: Emergency Medicine

## 2014-12-28 LAB — HEMOGLOBIN A1C, FINGERSTICK
Est. average glucose Bld gHb Est-mCnc: 140
HEMOGLOBIN-A1C: 6.5

## 2015-01-16 ENCOUNTER — Other Ambulatory Visit: Payer: Self-pay | Admitting: Neurosurgery

## 2015-01-16 DIAGNOSIS — M4302 Spondylolysis, cervical region: Secondary | ICD-10-CM

## 2015-02-02 ENCOUNTER — Ambulatory Visit
Admission: RE | Admit: 2015-02-02 | Discharge: 2015-02-02 | Disposition: A | Discharge status: Home / Self Care | Payer: Medicare Other | Source: Ambulatory Visit | Attending: Neurosurgery | Admitting: Neurosurgery

## 2015-02-02 DIAGNOSIS — M4302 Spondylolysis, cervical region: Secondary | ICD-10-CM

## 2015-03-06 LAB — SURGICAL PATHOLOGY

## 2015-04-18 ENCOUNTER — Telehealth: Payer: Self-pay

## 2015-04-18 NOTE — Telephone Encounter (Signed)
Patient reports that he seen Dr. Jefm Bryant yesterday and was prescribed Prednisone for inflammation. He reports that his blood sugars have been elevated since starting Prednisone. He states the pharmacist told him that this would happen. However, patient wants to know how high is too high? Patient reports that FBS this morning was 115, and after eating lunch his BS was 300. Patient also wants to know if you have received any records from Dr. Scharlene Gloss office? Please advise. Thanks!

## 2015-04-18 NOTE — Telephone Encounter (Signed)
Yes, we did receive records from Dr. Jefm Bryant. His sugar may go as high as 400 on the prednisone, which is OK for a short period of time. They should improved while he tapers the dose and go back to normal a few days after stopping prednisone.

## 2015-04-19 NOTE — Telephone Encounter (Signed)
Patient notified. Patient stated that FBS was back down to 120 this morning.

## 2015-04-27 ENCOUNTER — Telehealth: Payer: Self-pay | Admitting: Family Medicine

## 2015-04-27 MED ORDER — OXYCODONE-ACETAMINOPHEN 7.5-325 MG PO TABS: 1.0000 | ORAL_TABLET | Freq: Four times a day (QID) | ORAL | Status: DC | PRN

## 2015-04-27 NOTE — Telephone Encounter (Signed)
Pt came by the office since our phones are down requesting a rx for Percocet.  He will be out on Saturday June 18th.  Let him know when ready  [phone]

## 2015-05-15 ENCOUNTER — Other Ambulatory Visit: Payer: Self-pay | Admitting: Cardiovascular Disease

## 2015-05-19 ENCOUNTER — Telehealth: Payer: Self-pay | Admitting: Family Medicine

## 2015-05-19 NOTE — Telephone Encounter (Signed)
Please review message. Thanks!  

## 2015-05-19 NOTE — Telephone Encounter (Signed)
Pt wants to know if you can start prescribing his Dia diltiazem 50 mg TID,.  He has been getting it from Dr. [name] but says it would like to change it to you as the prescriber. He does not fell he needs to see you and Dr. [name].   He uses [facility].  His call back is  [phone].    Thanks, TP

## 2015-05-21 ENCOUNTER — Other Ambulatory Visit: Payer: Self-pay | Admitting: Family Medicine

## 2015-05-21 MED ORDER — DILTIAZEM HCL 60 MG PO TABS: 60.0000 mg | ORAL_TABLET | Freq: Three times a day (TID) | ORAL | Status: AC

## 2015-05-21 NOTE — Telephone Encounter (Signed)
rx has been sent to [facility]

## 2015-05-26 DIAGNOSIS — R200 Anesthesia of skin: Secondary | ICD-10-CM | POA: Insufficient documentation

## 2015-05-26 DIAGNOSIS — M25511 Pain in right shoulder: Secondary | ICD-10-CM | POA: Insufficient documentation

## 2015-05-26 DIAGNOSIS — Z87442 Personal history of urinary calculi: Secondary | ICD-10-CM | POA: Insufficient documentation

## 2015-05-26 DIAGNOSIS — M549 Dorsalgia, unspecified: Secondary | ICD-10-CM | POA: Insufficient documentation

## 2015-05-26 DIAGNOSIS — M2550 Pain in unspecified joint: Secondary | ICD-10-CM | POA: Insufficient documentation

## 2015-05-26 DIAGNOSIS — D649 Anemia, unspecified: Secondary | ICD-10-CM | POA: Insufficient documentation

## 2015-05-26 DIAGNOSIS — R0602 Shortness of breath: Secondary | ICD-10-CM | POA: Insufficient documentation

## 2015-05-26 DIAGNOSIS — K922 Gastrointestinal hemorrhage, unspecified: Secondary | ICD-10-CM | POA: Insufficient documentation

## 2015-05-26 DIAGNOSIS — E663 Overweight: Secondary | ICD-10-CM | POA: Insufficient documentation

## 2015-05-26 DIAGNOSIS — M7989 Other specified soft tissue disorders: Secondary | ICD-10-CM | POA: Insufficient documentation

## 2015-05-26 DIAGNOSIS — L84 Corns and callosities: Secondary | ICD-10-CM | POA: Insufficient documentation

## 2015-05-29 ENCOUNTER — Encounter: Payer: Self-pay | Admitting: *Deleted

## 2015-05-29 ENCOUNTER — Other Ambulatory Visit: Payer: Self-pay | Admitting: Family Medicine

## 2015-05-29 LAB — HEMOGLOBIN A1C, FINGERSTICK
Est. average glucose Bld gHb Est-mCnc: 143
Hemoglobin-A1c: 6.6

## 2015-05-29 NOTE — Telephone Encounter (Signed)
Pt contacted office for refill request on the following medications:  oxyCODONE-acetaminophen (PERCOCET) 7.5-325 MG.  CB#[phone].

## 2015-05-30 MED ORDER — OXYCODONE-ACETAMINOPHEN 7.5-325 MG PO TABS: 1.0000 | ORAL_TABLET | Freq: Four times a day (QID) | ORAL | Status: DC | PRN

## 2015-05-31 ENCOUNTER — Ambulatory Visit (INDEPENDENT_AMBULATORY_CARE_PROVIDER_SITE_OTHER): Payer: Medicare Other | Admitting: Family Medicine

## 2015-05-31 ENCOUNTER — Encounter: Payer: Self-pay | Admitting: Family Medicine

## 2015-05-31 VITALS — BP 110/52 | HR 56 | Temp 98.1°F | Resp 16 | Wt 190.0 lb

## 2015-05-31 DIAGNOSIS — G5602 Carpal tunnel syndrome, left upper limb: Secondary | ICD-10-CM | POA: Diagnosis not present

## 2015-05-31 DIAGNOSIS — E1121 Type 2 diabetes mellitus with diabetic nephropathy: Secondary | ICD-10-CM | POA: Diagnosis not present

## 2015-05-31 DIAGNOSIS — E119 Type 2 diabetes mellitus without complications: Secondary | ICD-10-CM | POA: Diagnosis not present

## 2015-05-31 DIAGNOSIS — G894 Chronic pain syndrome: Secondary | ICD-10-CM | POA: Diagnosis not present

## 2015-05-31 DIAGNOSIS — G5603 Carpal tunnel syndrome, bilateral upper limbs: Secondary | ICD-10-CM | POA: Insufficient documentation

## 2015-05-31 DIAGNOSIS — E782 Mixed hyperlipidemia: Secondary | ICD-10-CM | POA: Diagnosis not present

## 2015-05-31 DIAGNOSIS — Z8679 Personal history of other diseases of the circulatory system: Secondary | ICD-10-CM | POA: Diagnosis not present

## 2015-05-31 DIAGNOSIS — M1A9XX0 Chronic gout, unspecified, without tophus (tophi): Secondary | ICD-10-CM | POA: Diagnosis not present

## 2015-05-31 DIAGNOSIS — K219 Gastro-esophageal reflux disease without esophagitis: Secondary | ICD-10-CM | POA: Diagnosis not present

## 2015-05-31 DIAGNOSIS — I10 Essential (primary) hypertension: Secondary | ICD-10-CM | POA: Diagnosis not present

## 2015-05-31 DIAGNOSIS — G5601 Carpal tunnel syndrome, right upper limb: Secondary | ICD-10-CM

## 2015-05-31 LAB — POCT GLYCOSYLATED HEMOGLOBIN (HGB A1C)
Est. average glucose Bld gHb Est-mCnc: 146
HEMOGLOBIN A1C: 6.7

## 2015-05-31 NOTE — Progress Notes (Signed)
Patient: Bob Christensen Male    DOB: November 06, 1941   74 y.o.   MRN: [id number] Visit Date: 05/31/2015  Today's Provider: Lelon Huh, MD   Chief Complaint  Patient presents with  . Diabetes    follow up  . Hyperlipidemia    follow up  . Hypertension    follow up   Subjective:    HPI      Diabetes Mellitus Type II, Follow-up:   Lab Results  Component Value Date   HGBA1C 6.7 05/31/2015   HGBA1C 6.5 12/28/2014   HGBA1C 6.6 08/01/2014   Last seen for diabetes 5 months ago.  Management changes included none. He reports good compliance with treatment. He is not having side effects.  Current symptoms include none and have been stable. Home blood sugar records: fasting range: 100-120  Episodes of hypoglycemia? no   Current Insulin Regimen: none Most Recent Eye Exam: <1 year Weight trend: stable Prior visit with dietician: no Current diet: in general, a "healthy" diet   Current exercise: none  ------------------------------------------------------------------------   Hypertension, follow-up:  BP Readings from Last 3 Encounters:  05/31/15 110/52  12/28/14 124/56  06/27/14 128/62    He was last seen for hypertension 5 months ago.  BP at that visit was 124/56. Management changes since that visit include none .He reports good compliance with treatment. He is not having side effects.  He is not exercising. He is adherent to low salt diet.   Outside blood pressures are  120's/40's. He is experiencing none.  Patient denies chest pain, chest pressure/discomfort, dyspnea, irregular heart beat, lower extremity edema, near-syncope, orthopnea, palpitations, paroxysmal nocturnal dyspnea, syncope and tachypnea.   Cardiovascular risk factors include advanced age (older than 16 for men, 41 for women), diabetes mellitus, hypertension and male gender.  Use of agents associated with hypertension: none.    ------------------------------------------------------------------------    Lipid/Cholesterol, Follow-up:   Last seen for this 5 months ago.  Management changes since that visit include none.  Last Lipid Panel:    Component Value Date/Time   CHOL 120 08/02/2014   TRIG 90 08/02/2014   HDL 34* 08/02/2014   LDLCALC 68 08/02/2014    He reports good compliance with treatment. He is not having side effects.   Wt Readings from Last 3 Encounters:  05/31/15 190 lb (86.183 kg)  12/28/14 196 lb (88.905 kg)  06/27/14 197 lb 4 oz (89.472 kg)    ------------------------------------------------------------------------    Allergies  Allergen Reactions  . Digoxin    Previous Medications   ALLOPURINOL (ZYLOPRIM) 100 MG TABLET    Take 100 mg by mouth 2 (two) times daily.     ASPIRIN 81 MG EC TABLET    Take 81 mg by mouth daily.     ATORVASTATIN (LIPITOR) 20 MG TABLET    Take 20 mg by mouth daily.   DILTIAZEM (CARDIZEM) 60 MG TABLET    Take 1 tablet (60 mg total) by mouth 3 (three) times daily.   FINASTERIDE (PROSCAR) 5 MG TABLET    Take 5 mg by mouth daily.     GABAPENTIN (NEURONTIN) 600 MG TABLET    Take 600 mg by mouth 4 (four) times daily.   GLUCOSAMINE-CHONDROITIN-VIT C PO    Take 1 capsule by mouth daily.     LANSOPRAZOLE (PREVACID 24HR) 15 MG CAPSULE    Take 15 mg by mouth daily.   LISINOPRIL-HYDROCHLOROTHIAZIDE (PRINZIDE,ZESTORETIC) 20-12.5 MG PER TABLET    Take 1 tablet by  mouth daily.    MULTIPLE VITAMINS-MINERALS (CENTRUM SILVER PO)    Take 1 tablet by mouth daily.     NITROGLYCERIN (NITROSTAT) 0.4 MG SL TABLET    Place 0.4 mg under the tongue every 5 (five) minutes as needed.     OMEGA 3 340 MG CPDR    Take 340 mg by mouth daily.   OXYCODONE-ACETAMINOPHEN (PERCOCET) 7.5-325 MG PER TABLET    Take 1 tablet by mouth every 6 (six) hours as needed.   TAMSULOSIN HCL (FLOMAX) 0.4 MG CAPS    Take 0.4 mg by mouth daily.      Review of Systems  Constitutional: Positive for  fatigue. Negative for fever, chills, appetite change and unexpected weight change.  Respiratory: Negative for chest tightness, shortness of breath and wheezing.   Cardiovascular: Negative for palpitations and leg swelling.    History  Substance Use Topics  . Smoking status: Former Smoker -- 3.00 packs/day for 55 years    Types: Cigarettes    Quit date: 12/24/1987  . Smokeless tobacco: Not on file  . Alcohol Use: 0.0 oz/week    0 Standard drinks or equivalent per week     Comment: rare   Objective:   BP 110/52 mmHg  Pulse 56  Temp(Src) 98.1 F (36.7 C) (Oral)  Resp 16  Wt 190 lb (86.183 kg)  SpO2 96%     Physical Exam  General Appearance:    Alert, cooperative, no distress  Eyes:    PERRL, conjunctiva/corneas clear, EOM's intact       Lungs:     Clear to auscultation bilaterally, respirations unlabored  Heart:    Regular rate and rhythm  Neurologic:   Awake, alert, oriented x 3. No apparent focal neurological           defect.         Results for orders placed or performed in visit on 05/31/15  POCT HgB A1C  Result Value Ref Range   Hemoglobin A1C 6.7    Est. average glucose Bld gHb Est-mCnc 146        Assessment & Plan:     1. Type 2 diabetes mellitus without complication well controlled Continue current medications.   - POCT HgB A1C  2. Type 2 diabetes mellitus with diabetic nephropathy Check renal panel  3. Carpal tunnel syndrome on both sides Stable on current medications  4. Hyperlipidemia, mixed He is tolerating atorvastatin well with no adverse effects.   - Lipid panel  5. Hypertension, essential, benign well controlled Continue current medications.  - Renal function panel  6. Chronic pain disorder Doing well current pain medications. Completed form for [employer] parking permit  7. Gastroesophageal reflux disease, esophagitis presence not specified well controlled. He is going to try taking PPI every OTHER day.  - Magnesium  8. Chronic  gout without tophus, unspecified cause, unspecified site He states joints in toes get fairly stiff and a little swollen at times. Continue allorpurinol - Uric acid  9. History of atrial fibrillation NSR today on diltiazem.       Lelon Huh, MD  [facility]

## 2015-06-02 ENCOUNTER — Telehealth: Payer: Self-pay | Admitting: Family Medicine

## 2015-06-02 LAB — LIPID PANEL
Chol/HDL Ratio: 4.1 ratio units (ref 0.0–5.0)
Cholesterol, Total: 134 mg/dL (ref 100–199)
HDL: 33 mg/dL — ABNORMAL LOW (ref >39)
LDL Calculated: 76 mg/dL (ref 0–99)
TRIGLYCERIDES: 127 mg/dL (ref 0–149)
VLDL Cholesterol Cal: 25 mg/dL (ref 5–40)

## 2015-06-02 LAB — RENAL FUNCTION PANEL
ALBUMIN: 4.1 g/dL (ref 3.5–4.8)
BUN/Creatinine Ratio: 26 — ABNORMAL HIGH (ref 10–22)
BUN: 31 mg/dL — ABNORMAL HIGH (ref 8–27)
CALCIUM: 9.1 mg/dL (ref 8.6–10.2)
CHLORIDE: 107 mmol/L (ref 97–108)
CO2: 24 mmol/L (ref 18–29)
Creatinine, Ser: 1.21 mg/dL (ref 0.76–1.27)
GFR calc Af Amer: 68 mL/min/{1.73_m2} (ref >59)
GFR calc non Af Amer: 59 mL/min/{1.73_m2} — ABNORMAL LOW (ref >59)
Glucose: 109 mg/dL — ABNORMAL HIGH (ref 65–99)
POTASSIUM: 4.5 mmol/L (ref 3.5–5.2)
Phosphorus: 3.5 mg/dL (ref 2.5–4.5)
SODIUM: 146 mmol/L — AB (ref 134–144)

## 2015-06-02 LAB — URIC ACID: Uric Acid: 6.3 mg/dL (ref 3.7–8.6)

## 2015-06-02 LAB — MAGNESIUM: MAGNESIUM: 1.8 mg/dL (ref 1.6–2.3)

## 2015-06-02 NOTE — Telephone Encounter (Signed)
Patient notified

## 2015-06-02 NOTE — Telephone Encounter (Signed)
Pt is returning call.  CB#[phone]/[phone]

## 2015-06-06 DIAGNOSIS — M179 Osteoarthritis of knee, unspecified: Secondary | ICD-10-CM | POA: Insufficient documentation

## 2015-06-06 DIAGNOSIS — M1710 Unilateral primary osteoarthritis, unspecified knee: Secondary | ICD-10-CM | POA: Insufficient documentation

## 2015-06-28 ENCOUNTER — Other Ambulatory Visit: Payer: Self-pay | Admitting: Family Medicine

## 2015-06-28 NOTE — Telephone Encounter (Signed)
Pt called for a refill on his  oxyCODONE-acetaminophen (PERCOCET) 7.5-325 MG per tablet Taking 05/30/15 -- Birdie Sons, MD Take 1 tablet by mouth every 6 (six) hours as needed  Thanks Con Memos

## 2015-06-28 NOTE — Telephone Encounter (Signed)
Please review. Thanks!  

## 2015-06-29 MED ORDER — OXYCODONE-ACETAMINOPHEN 7.5-325 MG PO TABS: 1.0000 | ORAL_TABLET | Freq: Four times a day (QID) | ORAL | Status: DC | PRN

## 2015-07-27 ENCOUNTER — Other Ambulatory Visit: Payer: Self-pay | Admitting: Family Medicine

## 2015-07-27 NOTE — Telephone Encounter (Signed)
Pt contacted office for refill request on the following medications:  oxyCODONE-acetaminophen (PERCOCET) 7.5-325 MG.  CB#[phone]/MW

## 2015-07-28 MED ORDER — OXYCODONE-ACETAMINOPHEN 7.5-325 MG PO TABS: 1.0000 | ORAL_TABLET | Freq: Four times a day (QID) | ORAL | Status: DC | PRN

## 2015-08-15 ENCOUNTER — Telehealth: Payer: Self-pay | Admitting: *Deleted

## 2015-08-15 NOTE — Telephone Encounter (Signed)
LMOVM for pt to return call 

## 2015-08-15 NOTE — Telephone Encounter (Signed)
-----   Message from Birdie Sons, MD sent at 08/15/2015  8:17 AM EDT ----- Regarding: Pre-Op Please advise patient we received request for pre-op clearance from Dr. Jefm Bryant before he has knee replacement. Patient needs to schedule pre-op evaluation here AND with his cardiologist. Thanks.

## 2015-08-17 ENCOUNTER — Ambulatory Visit (INDEPENDENT_AMBULATORY_CARE_PROVIDER_SITE_OTHER): Payer: Medicare Other | Admitting: Family Medicine

## 2015-08-17 ENCOUNTER — Encounter: Payer: Self-pay | Admitting: Family Medicine

## 2015-08-17 VITALS — BP 116/54 | HR 53 | Temp 98.0°F | Resp 16 | Wt 189.0 lb

## 2015-08-17 DIAGNOSIS — R0609 Other forms of dyspnea: Secondary | ICD-10-CM

## 2015-08-17 DIAGNOSIS — R5383 Other fatigue: Secondary | ICD-10-CM | POA: Diagnosis not present

## 2015-08-17 DIAGNOSIS — Z01818 Encounter for other preprocedural examination: Secondary | ICD-10-CM | POA: Diagnosis not present

## 2015-08-17 DIAGNOSIS — K219 Gastro-esophageal reflux disease without esophagitis: Secondary | ICD-10-CM | POA: Diagnosis not present

## 2015-08-17 DIAGNOSIS — Z23 Encounter for immunization: Secondary | ICD-10-CM

## 2015-08-17 NOTE — Progress Notes (Signed)
Patient: Bob Christensen Male    DOB: Mar 31, 1941   74 y.o.   MRN: [id number] Visit Date: 08/17/2015  Today's Provider: Lelon Huh, MD   Chief Complaint  Patient presents with  . Medical Clearance   Subjective:    HPI  Is anticipating left knee replacement by Dr. Jefm Bryant. He had hip replacement in 2010 and had had no significant complications. He does have remote history of atrial fibrillation in SR for long period of time and has been followed by Dr. [name] who he last saw in August 2015. Patient states he has been increasingly fatigued and short of breath on exertion over the last few months. He denies chest pain, dizziness and syncope.   Patient Active Problem List   Diagnosis Date Noted  . Arthritis of knee, degenerative 06/06/2015  . Carpal tunnel syndrome on both sides 05/31/2015  . Pre-ulcerative calluses 05/26/2015  . Overweight 05/26/2015  . Numbness of hand 05/26/2015  . History of kidney stones 05/26/2015  . GI (gastrointestinal bleed) 05/26/2015  . Back pain with radiation 05/26/2015  . Arthralgia 05/26/2015  . Anemia 05/26/2015  . Fatigue 06/02/2013  . Elevated prostate specific antigen (PSA) 09/11/2012  . Chronic pain disorder 07/27/2012  . Numbness in feet 07/30/2011  . Diabetes mellitus with nephropathy ([place]) 04/06/2010  . Hyperlipidemia, mixed 04/06/2010  . Hypertension, essential, benign 04/06/2010  . CHEST PAIN-UNSPECIFIED 04/06/2010  . Abnormal prostate specific antigen 03/06/2010  . GERD (gastroesophageal reflux disease) 08/03/2009  . Benign prostatic hypertrophy without urinary obstruction 11/12/2007  . Gout 05/01/2006  . History of atrial fibrillation 11/12/1995   Past Medical History  Diagnosis Date  . History of colon polyps 2012    Tubular adenoma excised by Tiffany Kocher  . History of kidney stones   . Arthritis   . Brachial neuritis   . GI bleed     Following polypectomy  . Biceps tendon rupture 2015  . 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 Total hip arthroplasty Left 2010  . Melanoma on head  2011  . Lumbar laminectomy  2006    Dr Carloyn Manner  . Hand surgery  2012  . Carpal tunnel release  10/23/2011    left hand  . Lateral fusion lumbar spine, thoracotomy    . Melanoma excision Left 11/2011    Shoulder  . Cataract extraction, bilateral Bilateral 2013  . Nerve surgery Left 2011    Elbow  . Ulnar nerve repair  2010    Row    Allergies  Allergen Reactions  . Digoxin    Previous Medications   ALLOPURINOL (ZYLOPRIM) 100 MG TABLET    Take 100 mg by mouth 2 (two) times daily.     ASPIRIN 81 MG EC TABLET    Take 81 mg by mouth daily.     ATORVASTATIN (LIPITOR) 20 MG TABLET    Take 20 mg by mouth daily.   DILTIAZEM (CARDIZEM) 60 MG TABLET    Take 1 tablet (60 mg total) by mouth 3 (three) times daily.   FINASTERIDE (PROSCAR) 5 MG TABLET    Take 5 mg by mouth daily.     GABAPENTIN (NEURONTIN) 600 MG TABLET    Take 600 mg by mouth 4 (four) times daily.   GLUCOSAMINE-CHONDROITIN-VIT C PO    Take 1 capsule by mouth daily.     LANSOPRAZOLE (PREVACID  24HR) 15 MG CAPSULE    Take 15 mg by mouth daily.   LISINOPRIL-HYDROCHLOROTHIAZIDE (PRINZIDE,ZESTORETIC) 20-12.5 MG PER TABLET    Take 1 tablet by mouth daily.    MULTIPLE VITAMINS-MINERALS (CENTRUM SILVER PO)    Take 1 tablet by mouth daily.     NITROGLYCERIN (NITROSTAT) 0.4 MG SL TABLET    Place 0.4 mg under the tongue every 5 (five) minutes as needed.     OMEGA 3 340 MG CPDR    Take 340 mg by mouth daily.   OXYCODONE-ACETAMINOPHEN (PERCOCET) 7.5-325 MG PER TABLET    Take 1 tablet by mouth every 6 (six) hours as needed.   TAMSULOSIN HCL (FLOMAX) 0.4 MG CAPS    Take 0.4 mg by mouth daily.      Review of Systems  Constitutional: Positive for fatigue.  HENT: Negative.   Eyes: Positive for itching.  Respiratory: Positive for shortness of breath.     Cardiovascular: Negative for chest pain, palpitations and leg swelling.  Gastrointestinal: Negative.   Endocrine: Negative.   Genitourinary: Negative.   Musculoskeletal: Positive for back pain and gait problem.       Some back pain mid back  Skin: Negative.   Neurological: Positive for dizziness, weakness and numbness. Negative for light-headedness and headaches.       Numbness in bilateral hands  Hematological: Negative.   Psychiatric/Behavioral: Negative.     Social History  Substance Use Topics  . Smoking status: Former Smoker -- 3.00 packs/day for 55 years    Types: Cigarettes    Quit date: 12/24/1987  . Smokeless tobacco: Not on file  . Alcohol Use: 0.0 oz/week    0 Standard drinks or equivalent per week     Comment: rare   Objective:   BP 116/54 mmHg  Pulse 53  Temp(Src) 98 F (36.7 C) (Oral)  Resp 16  Wt 189 lb (85.73 kg)  SpO2 97%  Physical Exam   General Appearance:    Elderly male appears states age, somewhat overyweight. Alert, cooperative, no distress  Eyes:    PERRL, conjunctiva/corneas clear, EOM's intact       Lungs:     Clear to auscultation bilaterally, respirations unlabored  Heart:    Regular rate. Irregular rhythm.   Neurologic:   Awake, alert, oriented x 3. No apparent focal neurological           defect.   Vasc:    Trace pedal and ankle edema. No erythema. No cords.    Spirometry: Mild restriction     Assessment & Plan:     1. Pre-op examination If labs are normal then there are no NON-cardiac relative or absolute contraindications for surgery or general anesthesia and he would b at low risk for NON-cardiac complications. He needs evaluation and clearance from cardiology due to history of atrial fibrillation and recent development of dyspnea on exertion.   2. Other fatigue  - Comprehensive metabolic panel  3. Dyspnea on exertion No sign of lung disease. Check CBC and refer cardiology to rule out cardiac dysfunction.  - Spirometry with  graph - CBC  4. Need for influenza vaccination  - Flu vaccine HIGH DOSE PF  5. Gastroesophageal reflux disease, esophagitis presence not specified  - Magnesium        Lelon Huh, MD  [facility]

## 2015-08-18 LAB — COMPREHENSIVE METABOLIC PANEL
ALK PHOS: 90 IU/L (ref 39–117)
ALT: 13 IU/L (ref 0–44)
AST: 15 IU/L (ref 0–40)
Albumin/Globulin Ratio: 2.6 — ABNORMAL HIGH (ref 1.1–2.5)
Albumin: 4.4 g/dL (ref 3.5–4.8)
BUN / CREAT RATIO: 22 (ref 10–22)
BUN: 31 mg/dL — ABNORMAL HIGH (ref 8–27)
Bilirubin Total: 0.4 mg/dL (ref 0.0–1.2)
CO2: 23 mmol/L (ref 18–29)
Calcium: 9 mg/dL (ref 8.6–10.2)
Chloride: 107 mmol/L (ref 97–108)
Creatinine, Ser: 1.39 mg/dL — ABNORMAL HIGH (ref 0.76–1.27)
GFR calc Af Amer: 57 mL/min/{1.73_m2} — ABNORMAL LOW (ref >59)
GFR calc non Af Amer: 50 mL/min/{1.73_m2} — ABNORMAL LOW (ref >59)
GLOBULIN, TOTAL: 1.7 g/dL (ref 1.5–4.5)
Glucose: 142 mg/dL — ABNORMAL HIGH (ref 65–99)
POTASSIUM: 4.1 mmol/L (ref 3.5–5.2)
Sodium: 147 mmol/L — ABNORMAL HIGH (ref 134–144)
Total Protein: 6.1 g/dL (ref 6.0–8.5)

## 2015-08-18 LAB — CBC
HEMATOCRIT: 34.6 % — AB (ref 37.5–51.0)
Hemoglobin: 11.3 g/dL — ABNORMAL LOW (ref 12.6–17.7)
MCH: 31.4 pg (ref 26.6–33.0)
MCHC: 32.7 g/dL (ref 31.5–35.7)
MCV: 96 fL (ref 79–97)
Platelets: 184 10*3/uL (ref 150–379)
RBC: 3.6 x10E6/uL — ABNORMAL LOW (ref 4.14–5.80)
RDW: 15.4 % (ref 12.3–15.4)
WBC: 10.2 10*3/uL (ref 3.4–10.8)

## 2015-08-18 LAB — MAGNESIUM: Magnesium: 1.6 mg/dL (ref 1.6–2.3)

## 2015-08-29 ENCOUNTER — Other Ambulatory Visit: Payer: Self-pay | Admitting: Family Medicine

## 2015-08-29 MED ORDER — OXYCODONE-ACETAMINOPHEN 7.5-325 MG PO TABS: 1.0000 | ORAL_TABLET | Freq: Four times a day (QID) | ORAL | Status: DC | PRN

## 2015-08-29 NOTE — Telephone Encounter (Signed)
Pt contacted office for refill request on the following medications:   oxyCODONE-acetaminophen (PERCOCET) 7.5-325 MG.  CB#[phone]/MW

## 2015-08-31 ENCOUNTER — Ambulatory Visit (INDEPENDENT_AMBULATORY_CARE_PROVIDER_SITE_OTHER): Payer: Medicare Other | Admitting: Nurse Practitioner

## 2015-08-31 ENCOUNTER — Encounter: Payer: Self-pay | Admitting: Nurse Practitioner

## 2015-08-31 VITALS — BP 120/60 | HR 55 | Ht 67.0 in | Wt 184.8 lb

## 2015-08-31 DIAGNOSIS — I480 Paroxysmal atrial fibrillation: Secondary | ICD-10-CM | POA: Diagnosis not present

## 2015-08-31 DIAGNOSIS — E782 Mixed hyperlipidemia: Secondary | ICD-10-CM

## 2015-08-31 DIAGNOSIS — Z01810 Encounter for preprocedural cardiovascular examination: Secondary | ICD-10-CM | POA: Diagnosis not present

## 2015-08-31 DIAGNOSIS — Z8679 Personal history of other diseases of the circulatory system: Secondary | ICD-10-CM

## 2015-08-31 DIAGNOSIS — E119 Type 2 diabetes mellitus without complications: Secondary | ICD-10-CM

## 2015-08-31 DIAGNOSIS — R0609 Other forms of dyspnea: Secondary | ICD-10-CM

## 2015-08-31 DIAGNOSIS — I10 Essential (primary) hypertension: Secondary | ICD-10-CM | POA: Diagnosis not present

## 2015-08-31 NOTE — Progress Notes (Signed)
Patient Name: Bob Christensen Date of Encounter: 08/31/2015  Primary Care Provider:  Lelon Huh, MD Primary Cardiologist:  Johnny Bridge, MD   Chief Complaint  74 y/o male with a h/o PAF in 1997 with reportedly nl cath @ that time, who presents for preoperative evaluation pending L TKA.  Past Medical History   Past Medical History  Diagnosis Date  . History of colon polyps 2012    a. Tubular adenoma excised by Tiffany Kocher  . History of kidney stones   . Osteoarthritis   . Brachial neuritis   . GI bleed     a. Following polypectomy  . Biceps tendon rupture 2015  . Malignant melanoma of skin of scalp ([place]) 2011    a. Excised 2001 [facility])  . PAF (paroxysmal atrial fibrillation) ([place])     a. 1997 - no known recurrence since;  b. CHA2DS2VASc = 4.  . Carotid arterial disease ([place])     a. 07/2014 Carotid U/S: bilat 0-39% stenosis.  . Degenerative joint disease     a. 2010 s/p L THA;  b. 2016 - pending L knee arthroplasty.  . H/O cardiac catheterization     a. 1997 - reportedly normal.  . Essential hypertension   . Diet-controlled diabetes mellitus [facility])    Past Surgical History  Procedure Laterality Date  . Total hip arthroplasty Left 2010  . Melanoma on head  2011  . Lumbar laminectomy  2006    Dr Carloyn Manner  . Hand surgery  2012  . Carpal tunnel release  10/23/2011    left hand  . Lateral fusion lumbar spine, thoracotomy    . Melanoma excision Left 11/2011    Shoulder  . Cataract extraction, bilateral Bilateral 2013  . Nerve surgery Left 2011    Elbow  . Ulnar nerve repair  2010    [place]   Allergies  Allergies  Allergen Reactions  . Digoxin    HPI  74 year old male with a prior history of paroxysmal atrial fibrillation dating back to 72. At that time, he was evaluated and apparently underwent diagnostic catheterization which was normal. He has since been maintained on diltiazem therapy and to his knowledge, has not had any recurrence of  atrial fibrillation. In the absence of recurrent atrial fibrillation, he has not been anticoagulated but instead, is on aspirin. He also has a history of hypertension, diet-controlled diabetes mellitus, and degenerative joint disease. He has had severe left knee pain over the past year, which has significantly limited his activity. He has also noted over the same period of time, that when he walks more than approximately 50 yards, he will often "give out." Initially, he said that there is some dyspnea associated with this but later in our conversation, when we began to discuss stress testing, he denied this. He denies any prior history of chest pain, PND, orthopnea, syncope, edema, or early satiety. He does occasionally experience lightheadedness which he says is most  likely to occur when his blood pressure is less than 110 mmHg at home. He also reports about a 30 pound unintentional weight loss over the past few years. He says that his appetite has been fairly good and he is trying to remain active, working in his yard and trying to build a shed, but again, he is limited by left knee pain.  He recently saw his primary care provider for preoperative evaluation, at which time he mentioned dyspnea on exertion. Labs that day revealed mild renal insufficiency with  a creatinine of 1.39 and  hyperglycemia with a glucose of 142. He was referred for cardiac preoperative evaluation given complaints of dyspnea on exertion.  Home Medications  Prior to Admission medications   Medication Sig Start Date End Date Taking? Authorizing Provider  allopurinol (ZYLOPRIM) 100 MG tablet Take 100 mg by mouth 2 (two) times daily.     Yes Historical Provider, MD  aspirin 81 MG EC tablet Take 81 mg by mouth daily.     Yes Historical Provider, MD  atorvastatin (LIPITOR) 20 MG tablet Take 20 mg by mouth daily. 02/16/15  Yes Historical Provider, MD  diltiazem (CARDIZEM) 60 MG tablet Take 1 tablet (60 mg total) by mouth 3 (three) times  daily. 05/21/15  Yes Birdie Sons, MD  finasteride (PROSCAR) 5 MG tablet Take 5 mg by mouth daily.     Yes Historical Provider, MD  gabapentin (NEURONTIN) 600 MG tablet Take 600 mg by mouth 4 (four) times daily. 02/16/15  Yes Historical Provider, MD  GLUCOSAMINE-CHONDROITIN-VIT C PO Take 1 capsule by mouth daily.     Yes Historical Provider, MD  lansoprazole (PREVACID 24HR) 15 MG capsule Take 15 mg by mouth daily.   Yes Historical Provider, MD  lisinopril-hydrochlorothiazide (PRINZIDE,ZESTORETIC) 20-12.5 MG per tablet Take 1 tablet by mouth daily.    Yes Historical Provider, MD  Multiple Vitamins-Minerals (CENTRUM SILVER PO) Take 1 tablet by mouth daily.     Yes Historical Provider, MD  nitroGLYCERIN (NITROSTAT) 0.4 MG SL tablet Place 0.4 mg under the tongue every 5 (five) minutes as needed.     Yes Historical Provider, MD  Omega 3 340 MG CPDR Take 340 mg by mouth daily.   Yes Historical Provider, MD  oxyCODONE-acetaminophen (PERCOCET) 7.5-325 MG tablet Take 1 tablet by mouth every 6 (six) hours as needed. 08/29/15  Yes Birdie Sons, MD  Tamsulosin HCl (FLOMAX) 0.4 MG CAPS Take 0.4 mg by mouth daily.     Yes Historical Provider, MD    Review of Systems  As above, he has significant left knee pain, which limits his activities. He initially complained of some dyspnea on exertion after walking 5200 yards but then later recanted that statement.  He denies chest pain, palpitations, pnd, orthopnea, n, v, dizziness, syncope, edema, weight gain, or early satiety. He has had a 30 pound unintentional weight loss since 2011. All other systems reviewed and are otherwise negative except as noted above.  Physical Exam  VS:  BP 120/60 mmHg  Pulse 55  Ht 5\' 7"  (1.702 m)  Wt 184 lb 12 oz (83.802 kg)  BMI 28.93 kg/m2 , BMI Body mass index is 28.93 kg/(m^2). GEN: Well nourished, well developed, in no acute distress. HEENT: normal. Neck: Supple, no JVD, carotid bruits, or masses. Cardiac: RRR, no murmurs,  rubs, or gallops. No clubbing, cyanosis, edema.  Radials/DP/PT 2+ and equal bilaterally.  Respiratory:  Respirations regular and unlabored, clear to auscultation bilaterally. GI: Soft, nontender, nondistended, BS + x 4. MS: no deformity or atrophy. Skin: warm and dry, no rash. Neuro:  Strength and sensation are intact. Psych: Normal affect.  Accessory Clinical Findings  ECG - sinus bradycardia/sinus arrhythmia, 55, no acute ST or T changes.  Assessment & Plan  1.   Preoperative evaluation/degenerative joint disease and osteoarthritis of the left knee: Patient is pending left total knee arthroplasty. His activity is fairly limited related to left knee pain. He initially reported that he experiences dyspnea on exertion after walking 50-100 yards but once advised  that he would require a stress test for preoperative evaluation, he recanted that statement and said he does not experience dyspnea. Regardless, his activity is limited by his left leg discomfort and therefore it is difficult to gauge his true activity level. Given age and ongoing risk factors including hypertension, hyperlipidemia, and diabetes, I have recommended Lexiscan Cardiolite stress testing prior to surgery. He was quite vocal that he preferred to avoid stress testing and we spent quite some time discussing the indication for stress testing preoperatively, especially in light of poor activity tolerance and possibly some degree of dyspnea on exertion. We also discussed risks of stress testing including a < 1/10,000 risk of death.  Patient is now agreeable to undergo stress testing.  2. Paroxysmal atrial fibrillation: Patient denies any recent history of palpitations. He is in sinus rhythm today. He continues on diltiazem 60 mg 3 times a day and a baby aspirin.  3. Essential hypertension: This appears to be well controlled. At one point, Mr. Anastasi mentioned that he has occasional lightheadedness with blood pressures in the 100 range.  I advised that we could reduce his lisinopril dose if he is symptomatic, however he said he preferred to keep things as is. I then advised that he should keep track of his blood pressure and symptoms and if there is a clear pattern, he could either notify us or his primary care provider so that we can adjust his medication regimen.  4. Hyperlipidemia: LDL was 76 on July 21 of this year. Continue current statin dose.  5. Diet-controlled diabetes mellitus: He is not on medication for this. Followed by primary care. Recent presumably nonfasting blood glucose on October 6 was 142.  6. Unintentional weight loss: Patient reports about a 30 pound weight loss over the past few years. Looking back through Epic, he was approximately 212 in September 2011 and is currently 184. He says his appetite has been good and in fact, he eats out at fast food restaurants fairly frequently. His activity levels have been down overall related to his knee pain. He recently had labs at primary care which were relatively unrevealing. He had a normal TSH last September. He will follow up with primary care.  7. Disposition: Lexiscan Cardiolite to determine preoperative risk. Follow-up with Dr. [name] in 6 mos or sooner if necessary.  Murray Hodgkins, NP 08/31/2015, 12:42 PM

## 2015-08-31 NOTE — Patient Instructions (Addendum)
Medication Instructions:  Your physician recommends that you continue on your current medications as directed. Please refer to the Current Medication list given to you today.   Labwork: none  Testing/Procedures: Your physician has requested that you have a lexiscan myoview. For further information please visit [email or web address]. Please follow instruction sheet, as given.  Bob Christensen  Your caregiver has ordered a Stress Test with nuclear imaging. The purpose of this test is to evaluate the blood supply to your heart muscle. This procedure is referred to as a "Non-Invasive Stress Test." This is because other than having an IV started in your vein, nothing is inserted or "invades" your body. Cardiac stress tests are done to find areas of poor blood flow to the heart by determining the extent of coronary artery disease (CAD). Some patients exercise on a treadmill, which naturally increases the blood flow to your heart, while others who are  unable to walk on a treadmill due to physical limitations have a pharmacologic/chemical stress agent called Lexiscan . This medicine will mimic walking on a treadmill by temporarily increasing your coronary blood flow.   Please note: these test may take anywhere between 2-4 hours to complete  PLEASE REPORT TO [facility] AT THE FIRST DESK WILL DIRECT YOU WHERE TO GO  Date of Procedure: Monday, Oct 24, 8am Arrival Time for Procedure: 7:30a,  Instructions regarding medication:    ___x_:  Hold other medications as follows: lisinopril/HCTZ the morning of your test.  PLEASE NOTIFY THE OFFICE AT LEAST 24 HOURS IN ADVANCE IF YOU ARE UNABLE TO [place].  [phone] AND  PLEASE NOTIFY NUCLEAR MEDICINE AT [facility] AT LEAST 24 HOURS IN ADVANCE IF YOU ARE UNABLE TO KEEP YOUR APPOINTMENT. [phone]  How to prepare for your Myoview test:   Do not eat or drink after midnight  No caffeine for 24 hours prior to  test  No smoking 24 hours prior to test.  Your medication may be taken with water.  If your doctor stopped a medication because of this test, do not take that medication.  Ladies, please do not wear dresses.  Skirts or pants are appropriate. Please wear a short sleeve shirt.  No perfume, cologne or lotion.  Wear comfortable walking shoes.             Follow-Up: Your physician wants you to follow-up in: six months with Dr. [name]. You will receive a reminder letter in the mail two months in advance. If you don't receive a letter, please call our office to schedule the follow-up appointment.   Any Other Special Instructions Will Be Listed Below (If Applicable).  Cardiac Nuclear Scanning A cardiac nuclear scan is used to check your heart for problems, such as the following:  A portion of the heart is not getting enough blood.  Part of the heart muscle has died, which happens with a heart attack.  The heart wall is not working normally.  In this test, a radioactive dye (tracer) is injected into your bloodstream. After the tracer has traveled to your heart, a scanning device is used to measure how much of the tracer is absorbed by or distributed to various areas of your heart. LET [facility] CARE PROVIDER KNOW ABOUT:  Any allergies you have.  All medicines you are taking, including vitamins, herbs, eye drops, creams, and over-the-counter medicines.  Previous problems you or members of your family have had with the use of anesthetics.  Any blood disorders you  have.  Previous surgeries you have had.  Medical conditions you have.  RISKS AND COMPLICATIONS Generally, this is a safe procedure. However, as with any procedure, problems can occur. Possible problems include:   Serious chest pain.  Rapid heartbeat.  Sensation of warmth in your chest. This usually passes quickly. BEFORE THE PROCEDURE Ask your health care provider about changing or stopping your regular  medicines. PROCEDURE This procedure is usually done at a hospital and takes 2-4 hours.  An IV tube is inserted into one of your veins.  Your health care provider will inject a small amount of radioactive tracer through the tube.  You will then wait for 20-40 minutes while the tracer travels through your bloodstream.  You will lie down on an exam table so images of your heart can be taken. Images will be taken for about 15-20 minutes.  You will exercise on a treadmill or stationary bike. While you exercise, your heart activity will be monitored with an electrocardiogram (ECG), and your blood pressure will be checked.  If you are unable to exercise, you may be given a medicine to make your heart beat faster.  When blood flow to your heart has peaked, tracer will again be injected through the IV tube.  After 20-40 minutes, you will get back on the exam table and have more images taken of your heart.  When the procedure is over, your IV tube will be removed. AFTER THE PROCEDURE  You will likely be able to leave shortly after the test. Unless your health care provider tells you otherwise, you may return to your normal schedule, including diet, activities, and medicines.  Make sure you find out how and when you will get your test results.   This information is not intended to replace advice given to you by your health care provider. Make sure you discuss any questions you have with your health care provider.   Document Released: 11/22/2004 Document Revised: 11/02/2013 Document Reviewed: 10/06/2013 Elsevier Interactive Patient Education [employer].

## 2015-09-04 ENCOUNTER — Encounter
Admission: RE | Admit: 2015-09-04 | Discharge: 2015-09-04 | Disposition: A | Discharge status: Home / Self Care | Payer: Medicare Other | Source: Ambulatory Visit | Attending: Nurse Practitioner | Admitting: Nurse Practitioner

## 2015-09-04 DIAGNOSIS — Z01810 Encounter for preprocedural cardiovascular examination: Secondary | ICD-10-CM | POA: Diagnosis not present

## 2015-09-04 DIAGNOSIS — R0602 Shortness of breath: Secondary | ICD-10-CM | POA: Insufficient documentation

## 2015-09-04 LAB — NM MYOCAR MULTI W/SPECT W/WALL MOTION / EF
CHL CUP NUCLEAR SDS: 6
CHL CUP NUCLEAR SRS: 2
CHL CUP RESTING HR STRESS: 55 {beats}/min
LV dias vol: 140 mL
LV sys vol: 50 mL
NUC STRESS TID: 1.1
Peak HR: 72 {beats}/min
Percent HR: 49 %
SSS: 4

## 2015-09-04 MED ORDER — REGADENOSON 0.4 MG/5ML IV SOLN
0.4000 mg | Freq: Once | INTRAVENOUS | Status: AC
  Administered 2015-09-04: 0.4 mg via INTRAVENOUS

## 2015-09-04 MED ORDER — TECHNETIUM TC 99M SESTAMIBI - CARDIOLITE
30.9610 | Freq: Once | INTRAVENOUS | Status: AC | PRN
  Administered 2015-09-04: 30.961 via INTRAVENOUS

## 2015-09-04 MED ORDER — TECHNETIUM TC 99M SESTAMIBI - CARDIOLITE
12.3710 | Freq: Once | INTRAVENOUS | Status: AC | PRN
  Administered 2015-09-04: 12.731 via INTRAVENOUS

## 2015-09-05 ENCOUNTER — Telehealth: Payer: Self-pay

## 2015-09-05 NOTE — Telephone Encounter (Signed)
S/w Vaughan Basta at [facility] regarding cardiac clearance letter. She states to fax to Dr. Leanor Kail at [phone] [facility] sent message to him to notify of clearance.  Letter faxed

## 2015-09-05 NOTE — Telephone Encounter (Signed)
Attempted to fax clearance several times to [facility] in [place] S/w receptionist to verify  number. Fax correct. Will continue to send clearance.

## 2015-09-14 ENCOUNTER — Other Ambulatory Visit: Payer: Medicare Other

## 2015-09-18 ENCOUNTER — Encounter
Admission: RE | Admit: 2015-09-18 | Discharge: 2015-09-18 | Disposition: A | Discharge status: Home / Self Care | Payer: Medicare Other | Source: Ambulatory Visit | Attending: Unknown Physician Specialty | Admitting: Unknown Physician Specialty

## 2015-09-18 DIAGNOSIS — E785 Hyperlipidemia, unspecified: Secondary | ICD-10-CM | POA: Diagnosis not present

## 2015-09-18 DIAGNOSIS — Z7982 Long term (current) use of aspirin: Secondary | ICD-10-CM | POA: Diagnosis not present

## 2015-09-18 DIAGNOSIS — Z79899 Other long term (current) drug therapy: Secondary | ICD-10-CM | POA: Diagnosis not present

## 2015-09-18 DIAGNOSIS — G5600 Carpal tunnel syndrome, unspecified upper limb: Secondary | ICD-10-CM | POA: Diagnosis not present

## 2015-09-18 DIAGNOSIS — I4891 Unspecified atrial fibrillation: Secondary | ICD-10-CM | POA: Diagnosis not present

## 2015-09-18 DIAGNOSIS — M1712 Unilateral primary osteoarthritis, left knee: Secondary | ICD-10-CM | POA: Diagnosis not present

## 2015-09-18 DIAGNOSIS — K219 Gastro-esophageal reflux disease without esophagitis: Secondary | ICD-10-CM | POA: Diagnosis not present

## 2015-09-18 DIAGNOSIS — E1121 Type 2 diabetes mellitus with diabetic nephropathy: Secondary | ICD-10-CM | POA: Diagnosis not present

## 2015-09-18 DIAGNOSIS — Z01812 Encounter for preprocedural laboratory examination: Secondary | ICD-10-CM | POA: Diagnosis not present

## 2015-09-18 DIAGNOSIS — N400 Enlarged prostate without lower urinary tract symptoms: Secondary | ICD-10-CM | POA: Diagnosis not present

## 2015-09-18 DIAGNOSIS — D649 Anemia, unspecified: Secondary | ICD-10-CM | POA: Diagnosis not present

## 2015-09-18 LAB — POTASSIUM: Potassium: 4.2 mmol/L (ref 3.5–5.1)

## 2015-09-18 LAB — PROTIME-INR
INR: 1.1
Prothrombin Time: 14.4 seconds (ref 11.4–15.0)

## 2015-09-18 LAB — SURGICAL PCR SCREEN
MRSA, PCR: NEGATIVE
STAPHYLOCOCCUS AUREUS: NEGATIVE

## 2015-09-18 LAB — ABO/RH: ABO/RH(D): A POS

## 2015-09-18 LAB — APTT: aPTT: 28 seconds (ref 24–36)

## 2015-09-18 NOTE — Patient Instructions (Signed)
  Your procedure is scheduled on:09/27/15 Report to Day Surgery. To find out your arrival time please call [phone] between 1PM - 3PM on 09/26/15.  Remember: Instructions that are not followed completely may result in serious medical risk, up to and including death, or upon the discretion of your surgeon and anesthesiologist your surgery may need to be rescheduled.    __x__ 1. Do not eat food or drink liquids after midnight. No gum chewing or hard candies.     __x__ 2. No Alcohol for 24 hours before or after surgery.   ____ 3. Bring all medications with you on the day of surgery if instructed.    __x__ 4. Notify your doctor if there is any change in your medical condition     (cold, fever, infections).     Do not wear jewelry, make-up, hairpins, clips or nail polish.  Do not wear lotions, powders, or perfumes. You may wear deodorant.  Do not shave 48 hours prior to surgery. Men may shave face and neck.  Do not bring valuables to the hospital.    [facility] is not responsible for any belongings or valuables.               Contacts, dentures or bridgework may not be worn into surgery.  Leave your suitcase in the car. After surgery it may be brought to your room.  For patients admitted to the hospital, discharge time is determined by your                treatment team.   Patients discharged the day of surgery will not be allowed to drive home.   Please read over the following fact sheets that you were given:   MRSA Information and Surgical Site Infection Prevention   __x__ Take these medicines the morning of surgery with A SIP OF WATER:    1. atorvastatin  2. diltiazem  3. finasteride  4.gabapentin  5.percocet  6.  ____ Fleet Enema (as directed)   _x___ Use CHG Soap as directed  ____ Use inhalers on the day of surgery  ____ Stop metformin 2 days prior to surgery    ____ Take 1/2 of usual insulin dose the night before surgery and none on the morning of surgery.    __x__ Stop Coumadin/Plavix/aspirin on 09/20/15  __x__ Stop Anti-inflammatories on none after 09/20/15  May take tylenol   __x__ Stop supplements until after surgery.  09/20/15 (Glucosamine chondrotin and omega 3)  ____ Bring C-Pap to the hospital.

## 2015-09-19 ENCOUNTER — Ambulatory Visit
Admission: RE | Admit: 2015-09-19 | Discharge: 2015-09-19 | Disposition: A | Discharge status: Home / Self Care | Payer: Medicare Other | Source: Ambulatory Visit | Attending: Unknown Physician Specialty | Admitting: Unknown Physician Specialty

## 2015-09-19 DIAGNOSIS — E1121 Type 2 diabetes mellitus with diabetic nephropathy: Secondary | ICD-10-CM | POA: Insufficient documentation

## 2015-09-19 DIAGNOSIS — Z79899 Other long term (current) drug therapy: Secondary | ICD-10-CM | POA: Insufficient documentation

## 2015-09-19 DIAGNOSIS — Z01812 Encounter for preprocedural laboratory examination: Secondary | ICD-10-CM | POA: Diagnosis not present

## 2015-09-19 DIAGNOSIS — D649 Anemia, unspecified: Secondary | ICD-10-CM | POA: Insufficient documentation

## 2015-09-19 DIAGNOSIS — K219 Gastro-esophageal reflux disease without esophagitis: Secondary | ICD-10-CM | POA: Insufficient documentation

## 2015-09-19 DIAGNOSIS — G5600 Carpal tunnel syndrome, unspecified upper limb: Secondary | ICD-10-CM | POA: Insufficient documentation

## 2015-09-19 DIAGNOSIS — M1712 Unilateral primary osteoarthritis, left knee: Secondary | ICD-10-CM | POA: Insufficient documentation

## 2015-09-19 DIAGNOSIS — Z7982 Long term (current) use of aspirin: Secondary | ICD-10-CM | POA: Insufficient documentation

## 2015-09-19 DIAGNOSIS — N400 Enlarged prostate without lower urinary tract symptoms: Secondary | ICD-10-CM | POA: Insufficient documentation

## 2015-09-19 DIAGNOSIS — I4891 Unspecified atrial fibrillation: Secondary | ICD-10-CM | POA: Insufficient documentation

## 2015-09-19 DIAGNOSIS — E785 Hyperlipidemia, unspecified: Secondary | ICD-10-CM | POA: Insufficient documentation

## 2015-09-19 LAB — URINALYSIS COMPLETE WITH MICROSCOPIC (ARMC ONLY)
BACTERIA UA: NONE SEEN
Bilirubin Urine: NEGATIVE
GLUCOSE, UA: NEGATIVE mg/dL
Hgb urine dipstick: NEGATIVE
KETONES UR: NEGATIVE mg/dL
Leukocytes, UA: NEGATIVE
Nitrite: NEGATIVE
PROTEIN: NEGATIVE mg/dL
Specific Gravity, Urine: 1.016 (ref 1.005–1.030)
pH: 5 (ref 5.0–8.0)

## 2015-09-25 ENCOUNTER — Other Ambulatory Visit: Payer: Self-pay | Admitting: Family Medicine

## 2015-09-25 NOTE — Telephone Encounter (Signed)
Pt contacted office for refill request on the following medications: oxyCODONE-acetaminophen (PERCOCET) 7.5-325 MG tablet. Pt stated that he would like to pick this up today or tomorrow b/c he is going to have knee surgery on Wednesday 09/27/15 and will not be able to drive for awhile and wanted to go ahead and have this at home so it would be there when he needs it. Thanks TNP

## 2015-09-26 MED ORDER — OXYCODONE-ACETAMINOPHEN 7.5-325 MG PO TABS: 1.0000 | ORAL_TABLET | Freq: Four times a day (QID) | ORAL | Status: DC | PRN

## 2015-09-27 ENCOUNTER — Inpatient Hospital Stay: Payer: Medicare Other

## 2015-09-27 ENCOUNTER — Inpatient Hospital Stay
Admission: RE | Admit: 2015-09-27 | Discharge: 2015-09-30 | DRG: 470 | Disposition: A | Discharge status: Home Health | Payer: Medicare Other | Source: Ambulatory Visit | Attending: Unknown Physician Specialty | Admitting: Unknown Physician Specialty

## 2015-09-27 ENCOUNTER — Inpatient Hospital Stay: Payer: Medicare Other | Admitting: Certified Registered Nurse Anesthetist

## 2015-09-27 ENCOUNTER — Encounter
Admission: RE | Disposition: A | Discharge status: Home Health | Payer: Self-pay | Source: Ambulatory Visit | Attending: Unknown Physician Specialty

## 2015-09-27 DIAGNOSIS — J449 Chronic obstructive pulmonary disease, unspecified: Secondary | ICD-10-CM | POA: Diagnosis present

## 2015-09-27 DIAGNOSIS — I4891 Unspecified atrial fibrillation: Secondary | ICD-10-CM | POA: Diagnosis present

## 2015-09-27 DIAGNOSIS — E1121 Type 2 diabetes mellitus with diabetic nephropathy: Secondary | ICD-10-CM | POA: Diagnosis present

## 2015-09-27 DIAGNOSIS — Z96642 Presence of left artificial hip joint: Secondary | ICD-10-CM | POA: Diagnosis present

## 2015-09-27 DIAGNOSIS — Z87891 Personal history of nicotine dependence: Secondary | ICD-10-CM

## 2015-09-27 DIAGNOSIS — I10 Essential (primary) hypertension: Secondary | ICD-10-CM | POA: Diagnosis present

## 2015-09-27 DIAGNOSIS — G8929 Other chronic pain: Secondary | ICD-10-CM | POA: Diagnosis present

## 2015-09-27 DIAGNOSIS — G8918 Other acute postprocedural pain: Secondary | ICD-10-CM

## 2015-09-27 DIAGNOSIS — Z96659 Presence of unspecified artificial knee joint: Secondary | ICD-10-CM

## 2015-09-27 DIAGNOSIS — N400 Enlarged prostate without lower urinary tract symptoms: Secondary | ICD-10-CM | POA: Diagnosis present

## 2015-09-27 DIAGNOSIS — M1712 Unilateral primary osteoarthritis, left knee: Principal | ICD-10-CM | POA: Diagnosis present

## 2015-09-27 DIAGNOSIS — M549 Dorsalgia, unspecified: Secondary | ICD-10-CM | POA: Diagnosis present

## 2015-09-27 DIAGNOSIS — M1990 Unspecified osteoarthritis, unspecified site: Secondary | ICD-10-CM | POA: Diagnosis present

## 2015-09-27 DIAGNOSIS — Z85820 Personal history of malignant melanoma of skin: Secondary | ICD-10-CM | POA: Diagnosis not present

## 2015-09-27 HISTORY — PX: TOTAL KNEE ARTHROPLASTY: SHX125

## 2015-09-27 HISTORY — DX: Type 2 diabetes mellitus without complications: E11.9

## 2015-09-27 LAB — CBC
HCT: 31.2 % — ABNORMAL LOW (ref 40.0–52.0)
HEMOGLOBIN: 10.2 g/dL — AB (ref 13.0–18.0)
MCH: 31.7 pg (ref 26.0–34.0)
MCHC: 32.8 g/dL (ref 32.0–36.0)
MCV: 96.7 fL (ref 80.0–100.0)
Platelets: 137 10*3/uL — ABNORMAL LOW (ref 150–440)
RBC: 3.22 MIL/uL — AB (ref 4.40–5.90)
RDW: 14.5 % (ref 11.5–14.5)
WBC: 8.8 10*3/uL (ref 3.8–10.6)

## 2015-09-27 LAB — GLUCOSE, CAPILLARY: GLUCOSE-CAPILLARY: 101 mg/dL — AB (ref 65–99)

## 2015-09-27 LAB — CREATININE, SERUM
CREATININE: 1.09 mg/dL (ref 0.61–1.24)
GFR calc non Af Amer: >60 mL/min (ref >60)

## 2015-09-27 SURGERY — ARTHROPLASTY, KNEE, TOTAL
Anesthesia: Spinal | Laterality: Left

## 2015-09-27 MED ORDER — POLYETHYLENE GLYCOL 3350 17 G PO PACK
17.0000 g | PACK | Freq: Every day | ORAL | Status: DC | PRN
  Filled 2015-09-27: qty 1

## 2015-09-27 MED ORDER — NITROGLYCERIN 0.4 MG SL SUBL: 0.4000 mg | SUBLINGUAL_TABLET | SUBLINGUAL | Status: DC | PRN

## 2015-09-27 MED ORDER — TRANEXAMIC ACID 1000 MG/10ML IV SOLN
INTRAVENOUS | Status: AC
  Filled 2015-09-27: qty 10

## 2015-09-27 MED ORDER — SODIUM CHLORIDE 0.9 % IV SOLN
INTRAVENOUS | Status: DC
  Administered 2015-09-27 (×2): via INTRAVENOUS

## 2015-09-27 MED ORDER — BUPIVACAINE LIPOSOME 1.3 % IJ SUSP
INTRAMUSCULAR | Status: DC | PRN
  Administered 2015-09-27: 60 mL

## 2015-09-27 MED ORDER — TAMSULOSIN HCL 0.4 MG PO CAPS
0.4000 mg | ORAL_CAPSULE | Freq: Every morning | ORAL | Status: DC
  Administered 2015-09-27 – 2015-09-30 (×4): 0.4 mg via ORAL
  Filled 2015-09-27 (×4): qty 1

## 2015-09-27 MED ORDER — BUPIVACAINE-EPINEPHRINE (PF) 0.5% -1:200000 IJ SOLN
INTRAMUSCULAR | Status: DC | PRN
  Administered 2015-09-27: 30 mL

## 2015-09-27 MED ORDER — FINASTERIDE 5 MG PO TABS
5.0000 mg | ORAL_TABLET | Freq: Every day | ORAL | Status: DC
  Administered 2015-09-28 – 2015-09-30 (×3): 5 mg via ORAL
  Filled 2015-09-27 (×3): qty 1

## 2015-09-27 MED ORDER — GABAPENTIN 600 MG PO TABS
600.0000 mg | ORAL_TABLET | Freq: Four times a day (QID) | ORAL | Status: DC
  Administered 2015-09-27 – 2015-09-30 (×11): 600 mg via ORAL
  Filled 2015-09-27 (×11): qty 1

## 2015-09-27 MED ORDER — LISINOPRIL 20 MG PO TABS
20.0000 mg | ORAL_TABLET | Freq: Every day | ORAL | Status: DC
  Administered 2015-09-27 – 2015-09-30 (×4): 20 mg via ORAL
  Filled 2015-09-27 (×4): qty 1

## 2015-09-27 MED ORDER — CEFAZOLIN SODIUM-DEXTROSE 2-3 GM-% IV SOLR
2.0000 g | Freq: Once | INTRAVENOUS | Status: AC
  Administered 2015-09-27: 2 g via INTRAVENOUS

## 2015-09-27 MED ORDER — FENTANYL CITRATE (PF) 100 MCG/2ML IJ SOLN
25.0000 ug | INTRAMUSCULAR | Status: DC | PRN
  Administered 2015-09-27 (×4): 25 ug via INTRAVENOUS

## 2015-09-27 MED ORDER — CEFAZOLIN SODIUM-DEXTROSE 2-3 GM-% IV SOLR
INTRAVENOUS | Status: AC
  Filled 2015-09-27: qty 50

## 2015-09-27 MED ORDER — TRANEXAMIC ACID 1000 MG/10ML IV SOLN
1000.0000 mg | INTRAVENOUS | Status: DC | PRN
  Administered 2015-09-27: 1000 mg via TOPICAL

## 2015-09-27 MED ORDER — SODIUM CHLORIDE 0.45 % IV SOLN: INTRAVENOUS | Status: DC

## 2015-09-27 MED ORDER — EPHEDRINE SULFATE 50 MG/ML IJ SOLN
INTRAMUSCULAR | Status: DC | PRN
  Administered 2015-09-27 (×3): 10 mg via INTRAVENOUS

## 2015-09-27 MED ORDER — ENOXAPARIN SODIUM 30 MG/0.3ML ~~LOC~~ SOLN
30.0000 mg | Freq: Two times a day (BID) | SUBCUTANEOUS | Status: DC
  Administered 2015-09-28 – 2015-09-30 (×6): 30 mg via SUBCUTANEOUS
  Filled 2015-09-27 (×6): qty 0.3

## 2015-09-27 MED ORDER — FAMOTIDINE 20 MG PO TABS
ORAL_TABLET | ORAL | Status: AC
  Administered 2015-09-27: 20 mg via ORAL
  Filled 2015-09-27: qty 1

## 2015-09-27 MED ORDER — KCL IN DEXTROSE-NACL 20-5-0.45 MEQ/L-%-% IV SOLN
INTRAVENOUS | Status: DC
  Administered 2015-09-27: 16:00:00 via INTRAVENOUS
  Filled 2015-09-27 (×8): qty 1000

## 2015-09-27 MED ORDER — ONDANSETRON HCL 4 MG PO TABS: 4.0000 mg | ORAL_TABLET | Freq: Four times a day (QID) | ORAL | Status: DC | PRN

## 2015-09-27 MED ORDER — ONDANSETRON HCL 4 MG/2ML IJ SOLN: 4.0000 mg | Freq: Four times a day (QID) | INTRAMUSCULAR | Status: DC | PRN

## 2015-09-27 MED ORDER — PROPOFOL 500 MG/50ML IV EMUL
INTRAVENOUS | Status: DC | PRN
  Administered 2015-09-27: 40 ug/kg/min via INTRAVENOUS

## 2015-09-27 MED ORDER — ACETAMINOPHEN 325 MG PO TABS
650.0000 mg | ORAL_TABLET | Freq: Four times a day (QID) | ORAL | Status: DC | PRN
  Administered 2015-09-28: 650 mg via ORAL
  Filled 2015-09-27: qty 2

## 2015-09-27 MED ORDER — FENTANYL CITRATE (PF) 100 MCG/2ML IJ SOLN
INTRAMUSCULAR | Status: AC
  Administered 2015-09-27: 25 ug via INTRAVENOUS
  Filled 2015-09-27: qty 2

## 2015-09-27 MED ORDER — MIDAZOLAM HCL 2 MG/2ML IJ SOLN
INTRAMUSCULAR | Status: DC | PRN
Start: 2015-09-27 — End: 2015-09-27
  Administered 2015-09-27 (×2): 1 mg via INTRAVENOUS

## 2015-09-27 MED ORDER — DILTIAZEM HCL 60 MG PO TABS
60.0000 mg | ORAL_TABLET | Freq: Three times a day (TID) | ORAL | Status: DC
  Administered 2015-09-27 – 2015-09-30 (×8): 60 mg via ORAL
  Filled 2015-09-27 (×10): qty 1

## 2015-09-27 MED ORDER — CEFAZOLIN SODIUM 1-5 GM-% IV SOLN
1.0000 g | Freq: Three times a day (TID) | INTRAVENOUS | Status: AC
  Administered 2015-09-27 – 2015-09-28 (×3): 1 g via INTRAVENOUS
  Filled 2015-09-27 (×4): qty 50

## 2015-09-27 MED ORDER — BUPIVACAINE-EPINEPHRINE (PF) 0.5% -1:200000 IJ SOLN
INTRAMUSCULAR | Status: AC
  Filled 2015-09-27: qty 30

## 2015-09-27 MED ORDER — MENTHOL 3 MG MT LOZG: 1.0000 | LOZENGE | OROMUCOSAL | Status: DC | PRN

## 2015-09-27 MED ORDER — FAMOTIDINE 20 MG PO TABS
20.0000 mg | ORAL_TABLET | Freq: Once | ORAL | Status: AC
  Administered 2015-09-27: 20 mg via ORAL

## 2015-09-27 MED ORDER — HYDROMORPHONE HCL 1 MG/ML IJ SOLN
2.0000 mg | INTRAMUSCULAR | Status: DC | PRN
  Administered 2015-09-27 – 2015-09-28 (×6): 2 mg via INTRAVENOUS
  Filled 2015-09-27 (×6): qty 2

## 2015-09-27 MED ORDER — ALLOPURINOL 100 MG PO TABS
200.0000 mg | ORAL_TABLET | Freq: Every day | ORAL | Status: DC
  Administered 2015-09-27 – 2015-09-30 (×4): 200 mg via ORAL
  Filled 2015-09-27 (×4): qty 2

## 2015-09-27 MED ORDER — NEOMYCIN-POLYMYXIN B GU 40-200000 IR SOLN
Status: DC | PRN
  Administered 2015-09-27: 12 mL

## 2015-09-27 MED ORDER — FENTANYL CITRATE (PF) 100 MCG/2ML IJ SOLN
INTRAMUSCULAR | Status: DC | PRN
  Administered 2015-09-27: 50 ug via INTRAVENOUS

## 2015-09-27 MED ORDER — BUPIVACAINE LIPOSOME 1.3 % IJ SUSP
INTRAMUSCULAR | Status: AC
  Filled 2015-09-27: qty 20

## 2015-09-27 MED ORDER — HYDROCHLOROTHIAZIDE 12.5 MG PO CAPS
12.5000 mg | ORAL_CAPSULE | Freq: Every day | ORAL | Status: DC
  Administered 2015-09-27 – 2015-09-30 (×4): 12.5 mg via ORAL
  Filled 2015-09-27 (×4): qty 1

## 2015-09-27 MED ORDER — LIDOCAINE HCL (CARDIAC) 20 MG/ML IV SOLN
INTRAVENOUS | Status: DC | PRN
  Administered 2015-09-27: 40 mg via INTRAVENOUS

## 2015-09-27 MED ORDER — OMEGA-3-ACID ETHYL ESTERS 1 G PO CAPS
1000.0000 mg | ORAL_CAPSULE | Freq: Every day | ORAL | Status: DC
  Administered 2015-09-28 – 2015-09-30 (×3): 1000 mg via ORAL
  Filled 2015-09-27 (×4): qty 1

## 2015-09-27 MED ORDER — ATORVASTATIN CALCIUM 20 MG PO TABS
20.0000 mg | ORAL_TABLET | Freq: Every morning | ORAL | Status: DC
  Administered 2015-09-28 – 2015-09-30 (×3): 20 mg via ORAL
  Filled 2015-09-27 (×3): qty 1

## 2015-09-27 MED ORDER — TETRACAINE HCL 1 % IJ SOLN
INTRAMUSCULAR | Status: DC | PRN
  Administered 2015-09-27: 10 mg via INTRASPINAL

## 2015-09-27 MED ORDER — SODIUM CHLORIDE 0.9 % IJ SOLN
INTRAMUSCULAR | Status: AC
  Filled 2015-09-27: qty 50

## 2015-09-27 MED ORDER — LISINOPRIL-HYDROCHLOROTHIAZIDE 20-12.5 MG PO TABS: 1.0000 | ORAL_TABLET | Freq: Every day | ORAL | Status: DC

## 2015-09-27 MED ORDER — BUPIVACAINE HCL (PF) 0.75 % IJ SOLN
INTRAMUSCULAR | Status: DC | PRN
  Administered 2015-09-27: 1.5 mL

## 2015-09-27 MED ORDER — ONDANSETRON HCL 4 MG/2ML IJ SOLN: 4.0000 mg | Freq: Once | INTRAMUSCULAR | Status: DC | PRN

## 2015-09-27 MED ORDER — PANTOPRAZOLE SODIUM 40 MG PO TBEC
40.0000 mg | DELAYED_RELEASE_TABLET | Freq: Every day | ORAL | Status: DC
  Administered 2015-09-27 – 2015-09-30 (×4): 40 mg via ORAL
  Filled 2015-09-27 (×4): qty 1

## 2015-09-27 MED ORDER — OXYCODONE HCL 5 MG PO TABS
5.0000 mg | ORAL_TABLET | ORAL | Status: DC | PRN
  Administered 2015-09-27: 10 mg via ORAL
  Administered 2015-09-27 (×2): 5 mg via ORAL
  Administered 2015-09-28 – 2015-09-29 (×9): 10 mg via ORAL
  Administered 2015-09-30: 5 mg via ORAL
  Filled 2015-09-27 (×3): qty 2
  Filled 2015-09-27: qty 1
  Filled 2015-09-27 (×3): qty 2
  Filled 2015-09-27: qty 1
  Filled 2015-09-27 (×2): qty 2
  Filled 2015-09-27: qty 1
  Filled 2015-09-27 (×2): qty 2

## 2015-09-27 MED ORDER — ACETAMINOPHEN 650 MG RE SUPP: 650.0000 mg | Freq: Four times a day (QID) | RECTAL | Status: DC | PRN

## 2015-09-27 MED ORDER — NEOMYCIN-POLYMYXIN B GU 40-200000 IR SOLN
Status: AC
  Filled 2015-09-27: qty 16

## 2015-09-27 MED ORDER — PHENOL 1.4 % MT LIQD: 1.0000 | OROMUCOSAL | Status: DC | PRN

## 2015-09-27 SURGICAL SUPPLY — 58 items
BIT DRILL 2.8X5 QR DISP (BIT) ×3 IMPLANT
BLADE SAGITTAL AGGR TOOTH XLG (BLADE) ×3 IMPLANT
BLADE SAW 1/2 (BLADE) ×3 IMPLANT
BLADE SAW SAG 29X58X.64 (BLADE) ×3 IMPLANT
BLADE SURG 15 STRL LF DISP TIS (BLADE) ×1 IMPLANT
BLADE SURG 15 STRL SS (BLADE) ×2
BNDG COHESIVE 6X5 TAN STRL LF (GAUZE/BANDAGES/DRESSINGS) IMPLANT
BOWL CEMENT MIXING ADV NOZZLE (MISCELLANEOUS) IMPLANT
CANISTER SUCT 1200ML W/VALVE (MISCELLANEOUS) ×3 IMPLANT
CANISTER SUCT 3000ML (MISCELLANEOUS) ×3 IMPLANT
CAPT KNEE TRIATH TK-4 ×3 IMPLANT
CATH TRAY METER 16FR LF (MISCELLANEOUS) ×3 IMPLANT
CHLORAPREP W/TINT 26ML (MISCELLANEOUS) ×6 IMPLANT
COOLER POLAR GLACIER W/PUMP (MISCELLANEOUS) ×3 IMPLANT
DECANTER SPIKE VIAL GLASS SM (MISCELLANEOUS) ×9 IMPLANT
DRAPE INCISE IOBAN 66X45 STRL (DRAPES) IMPLANT
DRAPE SHEET LG 3/4 BI-LAMINATE (DRAPES) ×3 IMPLANT
DRSG AQUACEL AG ADV 3.5X10 (GAUZE/BANDAGES/DRESSINGS) IMPLANT
DRSG OPSITE POSTOP 4X10 (GAUZE/BANDAGES/DRESSINGS) IMPLANT
DRSG OPSITE POSTOP 4X12 (GAUZE/BANDAGES/DRESSINGS) ×3 IMPLANT
GAUZE PETRO XEROFOAM 1X8 (MISCELLANEOUS) IMPLANT
GAUZE SPONGE 4X4 12PLY STRL (GAUZE/BANDAGES/DRESSINGS) ×3 IMPLANT
GLOVE BIO SURGEON STRL SZ8 (GLOVE) ×12 IMPLANT
GLOVE BIOGEL M STRL SZ7.5 (GLOVE) ×3 IMPLANT
GLOVE INDICATOR 8.0 STRL GRN (GLOVE) ×3 IMPLANT
GOWN STRL REUS W/ TWL LRG LVL3 (GOWN DISPOSABLE) ×2 IMPLANT
GOWN STRL REUS W/TWL LRG LVL3 (GOWN DISPOSABLE) ×4
GOWN STRL REUS W/TWL LRG LVL4 (GOWN DISPOSABLE) ×6 IMPLANT
HANDPIECE SUCTION TUBG SURGILV (MISCELLANEOUS) ×3 IMPLANT
HOLDER KNEE ALVARADO LINER (Liner) ×3 IMPLANT
HOOD PEEL AWAY FLYTE STAYCOOL (MISCELLANEOUS) ×6 IMPLANT
KIT RM TURNOVER STRD PROC AR (KITS) ×3 IMPLANT
NDL SAFETY 18GX1.5 (NEEDLE) ×3 IMPLANT
NDL SAFETY 22GX1.5 (NEEDLE) ×3 IMPLANT
NEEDLE SPNL 18GX3.5 QUINCKE PK (NEEDLE) ×3 IMPLANT
NEEDLE SPNL 20GX3.5 QUINCKE YW (NEEDLE) ×6 IMPLANT
NS IRRIG 1000ML POUR BTL (IV SOLUTION) ×3 IMPLANT
PACK TOTAL KNEE (MISCELLANEOUS) ×3 IMPLANT
PAD ABD DERMACEA PRESS 5X9 (GAUZE/BANDAGES/DRESSINGS) ×3 IMPLANT
PAD GROUND ADULT SPLIT (MISCELLANEOUS) ×3 IMPLANT
PAD WRAPON POLAR KNEE (MISCELLANEOUS) ×1 IMPLANT
SOL .9 NS 3000ML IRR  AL (IV SOLUTION) ×2
SOL .9 NS 3000ML IRR UROMATIC (IV SOLUTION) ×1 IMPLANT
SOL PREP PVP 2OZ (MISCELLANEOUS) ×3
SOLUTION PREP PVP 2OZ (MISCELLANEOUS) ×1 IMPLANT
STAPLER SKIN PROX 35W (STAPLE) ×3 IMPLANT
SUCTION FRAZIER TIP 10 FR DISP (SUCTIONS) ×3 IMPLANT
SUT ETHIBOND CT1 BRD #0 30IN (SUTURE) ×9 IMPLANT
SUT ETHIBOND NAB CT1 #1 30IN (SUTURE) ×9 IMPLANT
SUT VIC AB 0 CT1 36 (SUTURE) ×6 IMPLANT
SUT VIC AB 2-0 CT1 27 (SUTURE) ×4
SUT VIC AB 2-0 CT1 TAPERPNT 27 (SUTURE) ×2 IMPLANT
SYR 20CC LL (SYRINGE) ×3 IMPLANT
SYR 30ML LL (SYRINGE) ×3 IMPLANT
SYR 50ML LL SCALE MARK (SYRINGE) ×3 IMPLANT
SYRINGE 10CC LL (SYRINGE) ×3 IMPLANT
WATER STERILE IRR 1000ML POUR (IV SOLUTION) ×3 IMPLANT
WRAPON POLAR PAD KNEE (MISCELLANEOUS) ×3

## 2015-09-27 NOTE — Care Management Note (Addendum)
Case Management Note  Patient Details  Name: Bob Christensen MRN: [id number] Date of Birth: 10-Mar-1941  Subjective/Objective:                  Met with patient and family regarding discharge planning. Patient just received from PACU. He denies having a TENs unit on and thought it was to be delivered to his home address. Apparently per [facility] Dr. Joellyn Rued pre-orders all TENs units and patient was supposed to pick unit up from [facility] ortho.  Patient plans to return home. PT evaluation pending. He has a rolling walker and bedside commode at home. He lives alone. He uses [facility] for Rx [phone].  Action/Plan: List of home health care agencies shared with patient. I have left message for [facility] Rep [phone]  to receive TENs unit as TENs was ordered per Adela Lank at [facility] ortho.   Expected Discharge Date:                  Expected Discharge Plan:     In-House Referral:     Discharge planning Services  CM Consult  Post Acute Care Choice:  Home Health Choice offered to:  Patient  DME Arranged:    DME Agency:     HH Arranged:    [place] Agency:     Status of Service:  In process, will continue to follow  Medicare Important Message Given:    Date Medicare IM Given:    Medicare IM give by:    Date Additional Medicare IM Given:    Additional Medicare Important Message give by:     If discussed at [place] of Stay Meetings, dates discussed:    Additional Comments: Lovenox 70m #14 [facility]. [facility] sent to Jerry/Tim with [facility]  AMarshell Garfinkel RN 09/27/2015, 3:00 PM

## 2015-09-27 NOTE — Op Note (Signed)
DATE OF SURGERY:  09/27/2015  PATIENT NAME:  Bob Christensen   DOB: April 24, 1941  MRN: [id number]  PRE-OPERATIVE DIAGNOSIS: Degenerative arthrosis of the left knee  POST-OPERATIVE DIAGNOSIS:  Degenerative arthrosis of the left knee  PROCEDURE:  Left total knee arthroplasty  SURGEON:  Dr.Garlen Reinig Leeanne Mannan. M.D.  ASSISTANT: Reche Dixon, PA-C  ANESTHESIA: Spinal  ESTIMATED BLOOD LOSS: 200 mL  TOURNIQUET TIME: 96 minutes   IMPLANTS UTILIZED: #7 [employer] Triathlon posterior stabilized femoral component, #7 9 mm thick tibial bearing insert, #7 tibial baseplate, 38 mm by 11 mm asymmetric patella  INDICATIONS FOR SURGERY: Bob Christensen is a 74 y.o. year old male with a long history of progressive knee pain. X-rays demonstrated severe degenerative changes in tricompartmental fashion. The patient had not seen any significant improvement despite conservative nonsurgical intervention. After discussion of the risks and benefits of surgical intervention, the patient expressed understanding of the risks benefits and agreed with plans for total knee arthroplasty.   The risks, benefits, and alternatives were discussed at length including but not limited to the risks of infection, bleeding, nerve injury, stiffness, blood clots, the need for revision surgery, cardiopulmonary complications, among others, and they were willing to proceed.  PROCEDURE:   The patient was taken to the operating room where satisfactory spinal anesthesia was achieved. A Foley catheter was inserted. The patient was given IV Kefzol prior to start of the procedure. A tourniquet was applied to left upper thigh. The left lower extremity was prepped and draped in the usual fashion for a total knee procedure. The left lower extremity was then exsanguinated, and the tourniquet was inflated. A medial parapatella incision was made.. Dissection was carried down through the subcutaneous tissue onto the capsule. This was divided in line with  the incision. The knee was then inspected. There was tricompartmental erosion of the articular surfaces with significant peripheral osteophytes.  I went ahead and drilled a hole in the intercondylar notch. Intramedullary rod was inserted. On the rod was the distal femoral cutting block. It was set for  5 degree valgus cut. The cutting block was fixed to the distal femur with smooth pins and then the intramedullary road was removed. The distal femoral cut was made, removing 10 mm of bone. I then went ahead and removed some medial and lateral compartment meniscal remnants. ACL and PCL were excised. The knee was hyperflexed. The femoral sizing guide was placed on the distal femur and was lined up with the epicondylar axis. It was felt that #7 femoral component was going to be the appropriate size. I went ahead and impacted the 4 in 1 cutting guide onto the distal femur. Anterior and posterior cuts were made followed by the anterior and posterior chamfer cuts. The intercondylar area was notched out using the appropriate guide.   External alignment jig was placed on the left leg and set for a cut 2 mm below the medial tibial plateau. The cut was sloped 3 degrees. I went ahead and made this cut without difficulty. I removed the cut bone from the proximal tibial which gave me a flush cut across the tibial surface. I then inserted a  gap spacer with the knee extended, and indeed it was felt that I could easily fit a  9 mm tibial bearing insert into the extension space.  I then sized the proximal tibia for a  #7 base plate. With the #7 femoral component in place and the 7 base plate in place, I was able to  insert a 9 mm trial tibial-bearing insert. With all the trials in place, the knee almost fully extended and flexed well. I then drilled holes through the trial femoral component for the pegs on the permanent femoral component. Next I resected about 8 mm of bone from retropatellar surface. I made peg holes for the  asymmetric patellar implant. Trial patella was inserted. It tracked well.   The trials were removed. The tibial base plate was positioned appropriately for the proximal tibial cruciate cut. The proximal tibial cruciate cut was made    I then sequentially impacted the components. The #7 porous-coated tibial base plate was impacted onto the proximal tibia. methacrylate.  I then impacted the #7 porous-coated femoral component onto the distal femur. I then inserted the permanent 9 mm tibial spacer. The knee was extended.  The knee came to almost full extension and flexed well. The knee seemed to be reasonably stable.   Tourniquet was released at this time. Tourniquet was up for about 96 minutes.  Bleeding was controlled with coagulation cautery. The wound was irrigated with GU irrigant.  I also injected the capsule and subcutaneous tissue with about about 50 cc of Exparel and saline mixture. I also injected the same tissues with about 30 cc of 0.5% Marcaine with epinephrine. I went ahead and closed the capsule with #0 ethibond sutures , subcu with  2-0 Vicryl, and skin with skin staples. Subsequent to the closure, I did go ahead and apply a sterile dressing and 4 TENS pads. Polar Care cooling pad was applied.  The patient was then transferred to a stretcher bed and taken to the recovery room in satisfactory condition. Blood loss was about 200 cc.        Dr.Stetson Pelaez Leeanne Mannan. M.D.

## 2015-09-27 NOTE — Anesthesia Postprocedure Evaluation (Signed)
  Anesthesia Post-op Note  Patient: Bob Christensen  Procedure(s) Performed: Procedure(s): TOTAL KNEE ARTHROPLASTY (Left)  Anesthesia type:Spinal  Patient location: PACU  Post pain: Pain level controlled  Post assessment: Post-op Vital signs reviewed, Patient's Cardiovascular Status Stable, Respiratory Function Stable, Patent Airway and No signs of Nausea or vomiting  Post vital signs: Reviewed and stable  Last Vitals:  Filed Vitals:   09/27/15 1130  BP: 118/55  Pulse: 61  Temp:   Resp: 10    Level of consciousness: awake, alert  and patient cooperative  Complications: No apparent anesthesia complications

## 2015-09-27 NOTE — Evaluation (Signed)
Physical Therapy Evaluation Patient Details Name: Bob Christensen MRN: [id number] DOB: 1940/12/02 Today's Date: 09/27/2015   History of Present Illness  Pt is a 74 yo male who was admitted to the hospital s/p R TKA on 09/27/15  Clinical Impression  Pt presents with hx of kidney stones, OA, GI bleed, CAD, HTN, and DM. Examination reveals that pt performs bed mobility at supervision, transfers at [facility], and ambulation of 20 ft at [facility]. Pt performing mobility wonderfully given the fact that he's POD 0. He does have ROM, strength, and gait deficits that will benefit from skilled PT in order for him to return to optimal PLOF. Pt has lots of supportive family present, and pt himself is very pleasant and motivated. Given current functional mobility, he will likely be able to discharge home with HHPT safely. Family finding out if pt has RW at home, which he'll need at d/c. Due to pt being able to successfully perform 10 AROM SLRs, KI was not donned during mobility tasks. Pt stable with no buckling.     Follow Up Recommendations Home health PT    Equipment Recommendations   (TBD - may or may not have RW)    Recommendations for Other Services       Precautions / Restrictions Precautions Precautions: Fall Restrictions Weight Bearing Restrictions: Yes Other Position/Activity Restrictions: WBAT (RLE)      Mobility  Bed Mobility Overal bed mobility: Needs Assistance Bed Mobility: Supine to Sit     Supine to sit: Supervision     General bed mobility comments: Pt performs bed mobility at supervision for hand placement and cues for guiding his LEs when getting to EOB. Good functional strength throughout movement  Transfers Overall transfer level: Needs assistance Equipment used: Rolling walker (2 wheeled) Transfers: Sit to/from Stand Sit to Stand: Min guard         General transfer comment: Pt performs transfers with cues for hand placement before rising and sitting. He is able to use  strong LLE and UEs to get himself into standing and doing so  Ambulation/Gait Ambulation/Gait assistance: Min guard Ambulation Distance (Feet): 20 Feet Assistive device: Rolling walker (2 wheeled) Gait Pattern/deviations: Step-through pattern;Decreased step length - right;Decreased step length - left;Decreased stride length Gait velocity: decreased Gait velocity interpretation: Below normal speed for age/gender General Gait Details: Pt already able to perform reciprocal gait pattern. Needs cues on sequencing of RW with gait. No unsteadiness or LOB. Pt tolerating pain well  Stairs            Wheelchair Mobility    Modified Rankin (Stroke Patients Only)       Balance Overall balance assessment: No apparent balance deficits (not formally assessed)                                           Pertinent Vitals/Pain Pain Assessment: 0-10 Pain Score: 3  Pain Location: R knee Pain Intervention(s): Limited activity within patient's tolerance;Monitored during session;Premedicated before session;Ice applied    Home Living Family/patient expects to be discharged to:: Private residence Living Arrangements: Alone Available Help at Discharge: Family;Available 24 hours/day Type of Home: House Home Access: Stairs to enter Entrance Stairs-Rails: None Entrance Stairs-Number of Steps: 2 Home Layout: One level Home Equipment: Walker - standard (possibly has RW - clarification needed)      Prior Function Level of Independence: Independent  Comments: Pt was ambulating community distances indep and performing ADLs indep     Hand Dominance        Extremity/Trunk Assessment   Upper Extremity Assessment: Overall WFL for tasks assessed           Lower Extremity Assessment: RLE deficits/detail RLE Deficits / Details: Gross RLE MMT 3/5       Communication   Communication: No difficulties  Cognition Arousal/Alertness: Awake/alert Behavior During  Therapy: WFL for tasks assessed/performed Overall Cognitive Status: Within Functional Limits for tasks assessed                      General Comments      Exercises Total Joint Exercises Goniometric ROM: R knee AAROM: 5 - 77 degrees  Other Exercises Other Exercises: Pt performed bilateral therex x 10 reps at supervision for proper techinque. Exercises performed: ankle pumps, quad sets, glute sets, SLR, hip abd and heel slides      Assessment/Plan    PT Assessment Patient needs continued PT services  PT Diagnosis Abnormality of gait;Acute pain   PT Problem List Decreased strength;Decreased activity tolerance;Decreased range of motion;Decreased mobility;Pain  PT Treatment Interventions DME instruction;Stair training;Gait training;Functional mobility training;Therapeutic activities;Therapeutic exercise;Balance training;Neuromuscular re-education   PT Goals (Current goals can be found in the Care Plan section) Acute Rehab PT Goals Patient Stated Goal: to return home PT Goal Formulation: With patient Time For Goal Achievement: 10/11/15 Potential to Achieve Goals: Good    Frequency BID   Barriers to discharge        Co-evaluation               End of Session Equipment Utilized During Treatment: Gait belt Activity Tolerance: Patient tolerated treatment well Patient left: in chair;with call bell/phone within reach;with chair alarm set;with SCD's reapplied;with family/visitor present Nurse Communication: Mobility status         Time: [id number] PT Time Calculation (min) (ACUTE ONLY): 27 min   Charges:         PT G CodesJanyth Contes 10-23-2015, 5:08 PM  Janyth Contes, SPT. [phone]

## 2015-09-27 NOTE — Anesthesia Procedure Notes (Signed)
Spinal  Start time: 09/27/2015 7:49 AM End time: 09/27/2015 7:59 AM Reason for block: at surgeon's request Staffing Anesthesiologist: Marline Backbone F Performed by: anesthesiologist  Preanesthetic Checklist Completed: patient identified, site marked, surgical consent, pre-op evaluation, timeout performed, IV checked, risks and benefits discussed, monitors and equipment checked and at surgeon's request Spinal Block Patient position: sitting Prep: Betadine Patient monitoring: heart rate and blood pressure Approach: right paramedian Location: L3-4 Injection technique: single-shot Needle Needle type: Tuohy  Needle gauge: 22 G Needle length: 9 cm Needle insertion depth: 7 cm Assessment Sensory level: T8

## 2015-09-27 NOTE — Transfer of Care (Signed)
Immediate Anesthesia Transfer of Care Note  Patient: Bob Christensen  Procedure(s) Performed: Procedure(s): TOTAL KNEE ARTHROPLASTY (Left)  Patient Location: PACU  Anesthesia Type:GA combined with regional for post-op pain  Level of Consciousness: awake, alert  and oriented  Airway & Oxygen Therapy: Patient Spontanous Breathing and Patient connected to face mask oxygen  Post-op Assessment: Report given to RN and Post -op Vital signs reviewed and stable  Post vital signs: Reviewed and stable    Last Vitals: 11:00 am 111/51 97% sat 62hr 14resp 98.5 temp Filed Vitals:   09/27/15 0625  BP: 130/59  Pulse: 64  Temp: 36 C  Resp: 16    Complications: No apparent anesthesia complications

## 2015-09-27 NOTE — Anesthesia Preprocedure Evaluation (Signed)
Anesthesia Evaluation  Patient identified by MRN, date of birth, ID band Patient awake    Reviewed: Allergy & Precautions, NPO status , Patient's Chart, lab work & pertinent test results  Airway Mallampati: II       Dental  (+) Partial Upper   Pulmonary shortness of breath, COPD, former smoker,     + decreased breath sounds      Cardiovascular hypertension, Pt. on medications + Peripheral Vascular Disease   Rhythm:Regular Rate:Normal     Neuro/Psych    GI/Hepatic GERD  ,  Endo/Other  diabetes, Type 2  Renal/GU      Musculoskeletal   Abdominal Normal abdominal exam  (+)   Peds  Hematology  (+) anemia ,   Anesthesia Other Findings   Reproductive/Obstetrics                             Anesthesia Physical Anesthesia Plan  ASA: III  Anesthesia Plan: Spinal   Post-op Pain Management:    Induction:   Airway Management Planned: Nasal Cannula  Additional Equipment:   Intra-op Plan:   Post-operative Plan:   Informed Consent: I have reviewed the patients History and Physical, chart, labs and discussed the procedure including the risks, benefits and alternatives for the proposed anesthesia with the patient or authorized representative who has indicated his/her understanding and acceptance.     Plan Discussed with: CRNA  Anesthesia Plan Comments:         Anesthesia Quick Evaluation

## 2015-09-27 NOTE — H&P (Signed)
  H and P reviewed. No changes. Uploaded at later date. 

## 2015-09-28 LAB — CBC
HCT: 28.7 % — ABNORMAL LOW (ref 40.0–52.0)
Hemoglobin: 9.4 g/dL — ABNORMAL LOW (ref 13.0–18.0)
MCH: 31.4 pg (ref 26.0–34.0)
MCHC: 32.6 g/dL (ref 32.0–36.0)
MCV: 96.5 fL (ref 80.0–100.0)
PLATELETS: 127 10*3/uL — AB (ref 150–440)
RBC: 2.98 MIL/uL — ABNORMAL LOW (ref 4.40–5.90)
RDW: 14.6 % — AB (ref 11.5–14.5)
WBC: 6.8 10*3/uL (ref 3.8–10.6)

## 2015-09-28 LAB — TYPE AND SCREEN
ABO/RH(D): A POS
Antibody Screen: NEGATIVE
UNIT DIVISION: 0
Unit division: 0

## 2015-09-28 LAB — BASIC METABOLIC PANEL
Anion gap: 4 — ABNORMAL LOW (ref 5–15)
BUN: 22 mg/dL — AB (ref 6–20)
CALCIUM: 8 mg/dL — AB (ref 8.9–10.3)
CO2: 27 mmol/L (ref 22–32)
CREATININE: 0.93 mg/dL (ref 0.61–1.24)
Chloride: 107 mmol/L (ref 101–111)
GFR calc Af Amer: >60 mL/min (ref >60)
Glucose, Bld: 150 mg/dL — ABNORMAL HIGH (ref 65–99)
Potassium: 4.2 mmol/L (ref 3.5–5.1)
SODIUM: 138 mmol/L (ref 135–145)

## 2015-09-28 LAB — SURGICAL PATHOLOGY

## 2015-09-28 LAB — PREPARE RBC (CROSSMATCH)

## 2015-09-28 MED ORDER — HYDROMORPHONE HCL 1 MG/ML IJ SOLN: 1.0000 mg | INTRAMUSCULAR | Status: DC | PRN

## 2015-09-28 MED ORDER — TRAMADOL HCL 50 MG PO TABS
50.0000 mg | ORAL_TABLET | ORAL | Status: DC | PRN
  Administered 2015-09-28: 50 mg via ORAL
  Administered 2015-09-30: 100 mg via ORAL
  Filled 2015-09-28: qty 2
  Filled 2015-09-28: qty 1

## 2015-09-28 NOTE — Patient Instructions (Signed)
Pt was ed on using AE for LB dressing - demo, verbal and tactile cues - demo back by pt

## 2015-09-28 NOTE — Progress Notes (Signed)
Physical Therapy Treatment Patient Details Name: Bob Christensen MRN: [id number] DOB: 01-31-1941 Today's Date: 09/28/2015    History of Present Illness Pt 74 yr old gentleman that was admitted for TKR on L on 09/27/15    PT Comments    Pt with improved tolerance to activity this PM.  Improved transition during supine to sit transfer however con't to require cues for PHP to improve safety with sit to stand transfers.  Pt ambulated 38ft with CGA using RW, initially step to pattern progressing to step through pattern however demonstrated decreased heel strike with gait.  Pt performed supine and seated therex as noted with added self knee flexion stretch.  PROM 80 degrees in sitting.  HEP provided.   Follow Up Recommendations  Home health PT     Equipment Recommendations       Recommendations for Other Services       Precautions / Restrictions Precautions Precautions: Fall Restrictions Weight Bearing Restrictions: Yes LLE Weight Bearing: Weight bearing as tolerated    Mobility  Bed Mobility Overal bed mobility: Needs Assistance Bed Mobility: Supine to Sit     Supine to sit: Supervision     General bed mobility comments: Pt with improved mobilty this aftermoon  Transfers Overall transfer level: Needs assistance Equipment used: Rolling walker (2 wheeled) Transfers: Sit to/from Stand Sit to Stand: Min guard         General transfer comment: Pt performed sit to stand x2 with cues for PHP for safety with RW  Ambulation/Gait Ambulation/Gait assistance: Min guard Ambulation Distance (Feet): 50 Feet Assistive device: Rolling walker (2 wheeled) Gait Pattern/deviations: Step-through pattern;Decreased step length - left     General Gait Details: Pt initiallty small steps with step to pattern progressed to step through with flat foot on L    Stairs            Wheelchair Mobility    Modified Rankin (Stroke Patients Only)       Balance Overall balance  assessment: No apparent balance deficits (not formally assessed)                                  Cognition Arousal/Alertness: Awake/alert Behavior During Therapy: WFL for tasks assessed/performed Overall Cognitive Status: Within Functional Limits for tasks assessed                      Exercises Other Exercises Other Exercises: Pt performed supine SLR, QS, ankle pumps x 10, sitting, LAQ, hip abd hip flexion, and instructed in self knee flexion stretch, HEP provided    General Comments        Pertinent Vitals/Pain Pain Assessment: 0-10 Pain Score: 8  Pain Location: R knee Pain Intervention(s): Monitored during session;RN gave pain meds during session    Home Living                      Prior Function            PT Goals (current goals can now be found in the care plan section) Acute Rehab PT Goals Patient Stated Goal: to return home PT Goal Formulation: With patient Time For Goal Achievement: 10/11/15 Potential to Achieve Goals: Good    Frequency  BID    PT Plan      Co-evaluation             End of Session Equipment Utilized During  Treatment: Gait belt Activity Tolerance: Patient tolerated treatment well Patient left: in chair;with family/visitor present     Time: [id number] PT Time Calculation (min) (ACUTE ONLY): 30 min  Charges:  $Gait Training: 8-22 mins $Therapeutic Exercise: 8-22 mins $Therapeutic Activity: 8-22 mins                    G Codes:      Sandar Krinke October 27, 2015, 3:19 PM Kemper Heupel, PTA

## 2015-09-28 NOTE — Progress Notes (Signed)
  Subjective: 1 Day Post-Op Procedure(s) (LRB): TOTAL KNEE ARTHROPLASTY (Left) Patient reports pain as moderate.   Patient seen in rounds with Dr. Jefm Bryant. Patient is well, and has had no acute complaints or problems Plan is to go Home after hospital stay. Negative for chest pain and shortness of breath Fever: no Gastrointestinal: Negative for nausea and vomiting  Objective: Vital signs in last 24 hours: Temp:  [96.8 F (36 C)-99.1 F (37.3 C)] 99.1 F (37.3 C) (11/17 0348) Pulse Rate:  [53-88] 88 (11/17 0348) Resp:  [10-18] 16 (11/17 0348) BP: (111-165)/(51-69) 152/61 mmHg (11/17 0348) SpO2:  [93 %-98 %] 93 % (11/17 0348) Weight:  [83.462 kg (184 lb)] 83.462 kg (184 lb) (11/16 0625)  Intake/Output from previous day:  Intake/Output Summary (Last 24 hours) at 09/28/15 0612 Last data filed at 09/28/15 0551  Gross per 24 hour  Intake 3413.5 ml  Output   2175 ml  Net 1238.5 ml    Intake/Output this shift: Total I/O In: 1057.5 [I.V.:907.5; IV Piggyback:150] Out: 1300 [Urine:1300]  Labs:  Recent Labs  09/27/15 1320 09/28/15 0410  HGB 10.2* 9.4*    Recent Labs  09/27/15 1320 09/28/15 0410  WBC 8.8 6.8  RBC 3.22* 2.98*  HCT 31.2* 28.7*  PLT 137* 127*    Recent Labs  09/27/15 1320  CREATININE 1.09   No results for input(s): LABPT, INR in the last 72 hours.   EXAM General - Patient is Alert and Oriented Extremity - Sensation intact distally Dorsiflexion/Plantar flexion intact Dressing/Incision - clean, dry, no drainage Motor Function - intact, moving foot and toes well on exam.   Past Medical History  Diagnosis Date  . History of colon polyps 2012    a. Tubular adenoma excised by Tiffany Kocher  . History of kidney stones   . Osteoarthritis   . Brachial neuritis   . GI bleed     a. Following polypectomy  . Biceps tendon rupture 2015  . Malignant melanoma of skin of scalp ([place]) 2011    a. Excised 2001 [facility] [place] [facility])  . PAF (paroxysmal  atrial fibrillation) ([place])     a. 1997 - no known recurrence since;  b. CHA2DS2VASc = 4.  . Carotid arterial disease ([place])     a. 07/2014 Carotid U/S: bilat 0-39% stenosis.  . Degenerative joint disease     a. 2010 s/p L THA;  b. 2016 - pending L knee arthroplasty.  . H/O cardiac catheterization     a. 1997 - reportedly normal.  . Essential hypertension   . Diet-controlled diabetes mellitus ([place])   . Diabetes mellitus without complication (H[facility])     Assessment/Plan: 1 Day Post-Op Procedure(s) (LRB): TOTAL KNEE ARTHROPLASTY (Left) Active Problems:   S/P total knee replacement  Estimated body mass index is 28.81 kg/(m^2) as calculated from the following:   Height as of this encounter: 5\' 7"  (1.702 m).   Weight as of this encounter: 83.462 kg (184 lb). Advance diet Up with therapy  DVT Prophylaxis - Lovenox, Foot Pumps and TED hose Weight-Bearing as tolerated to left leg  Reche Dixon, PA-C Orthopaedic Surgery 09/28/2015, 6:12 AM

## 2015-09-28 NOTE — Evaluation (Signed)
Occupational Therapy Evaluation Patient Details Name: Bob Christensen MRN: [id number] DOB: 09-07-1941 Today's Date: 09/28/2015    History of Present Illness Pt 74 yr old gentleman that was admitted for TKR on L on 09/27/15   Clinical Impression   Pt present 2 days postop TKR - pt show decrease LB dressing , clothing management, need v/c for safety during sit<> stand and functional mobility in ADL's - pt can benefit from OT services     Follow Up Recommendations  Home health OT    Equipment Recommendations  3 in 1 bedside comode    Recommendations for Other Services PT consult     Precautions / Restrictions Precautions Precautions: Fall Restrictions Weight Bearing Restrictions: Yes LLE Weight Bearing: Weight bearing as tolerated Other Position/Activity Restrictions: Can do straight leg lift - knee immobilizer d/c      Mobility Bed Mobility                  Transfers                      Balance                                            ADL                                               Vision     Perception     Praxis      Pertinent Vitals/Pain Pain Assessment: 0-10 Pain Score: 3  Pain Location: R knee Pain Intervention(s): Repositioned;Ice applied;Limited activity within patient's tolerance     Hand Dominance Right   Extremity/Trunk Assessment             Communication Communication Communication: No difficulties   Cognition Arousal/Alertness: Awake/alert Behavior During Therapy: WFL for tasks assessed/performed Overall Cognitive Status: Within Functional Limits for tasks assessed                     General Comments       Exercises   Other Exercises Other Exercises: Pt need mod A for LB dressing using AE(reacher and sockaid)  - do have AE from prior Hip surgery - but need new reacher ; sit <> stand CG with min v/c for hand placement  Other Exercises: Ambulate to BR with  CG - no LOB using RW and BSC over toilet  - toileting hygiene S , clothing management CG wtih min v/c    Shoulder Instructions      Home Living Family/patient expects to be discharged to:: Private residence Living Arrangements: Alone Available Help at Discharge: Family;Available 24 hours/day Type of Home: House Home Access: Stairs to enter     Home Layout: One level;Other (Comment)     Bathroom Shower/Tub: Walk-in shower         Home Equipment: Environmental consultant - standard          Prior Functioning/Environment Level of Independence: Independent        Comments: ind in ADL's and IADl's - and community  activities    OT Diagnosis: Generalized weakness   OT Problem List: Impaired balance (sitting and/or standing);Decreased activity tolerance;Decreased safety awareness;Pain;Decreased knowledge of use of DME or AE;Decreased strength  OT Treatment/Interventions: Self-care/ADL training;Neuromuscular education;DME and/or AE instruction;Patient/family education;Balance training    OT Goals(Current goals can be found in the care plan section) Acute Rehab OT Goals Patient Stated Goal: to return home OT Goal Formulation: With patient Time For Goal Achievement: 10/11/15 Potential to Achieve Goals: Good  OT Frequency: Min 1X/week   OT GOALS: 1; Pt to be S using toilet in BR and clothing management 2 wks                      2: Pt to be Min A using AE to donn and doff socks and pants  2 wks  Barriers to D/C:            Co-evaluation              End of Session Equipment Utilized During Treatment: Gait belt  Activity Tolerance: Patient tolerated treatment well Patient left: in chair;with chair alarm set;with call bell/phone within reach   Time: [id number] OT Time Calculation (min): 40 min Charges:  OT General Charges $OT Visit: 1 Procedure OT Evaluation $Initial OT Evaluation Tier I: 1 Procedure OT Treatments $Self Care/Home Management : 8-22 mins G-Codes:     Wilhelm Ganaway OTR/L,  CLT 09/28/2015, 11:17 AM

## 2015-09-28 NOTE — Progress Notes (Signed)
Clinical Social Worker (CSW) received SNF consult. PT is recommending home health. RN Case Manager aware of above. Please reconsult if future social work needs arise. CSW signing off.   Norva Bowe Morgan, LCSWA [phone] 

## 2015-09-28 NOTE — Progress Notes (Signed)
Paged Reche Dixon, PA to review pain medications listed for patient.  Only have oxycodone po and IV dilaudid 2 mg available.  Patient calling out for pain medication every 2 hours.  He lowered IV dilaudid to 1 mg and advised to start to wean from, ordered Tramadol for mild to moderate pain.

## 2015-09-28 NOTE — Progress Notes (Signed)
Patient called out saying "I can not breathe".  Upon assessment, patient completing full sentences, coughing and producing sputum.  Vital signs taken and stable.  Patient further explains that his nose is stopped up and he can't breathe through it.  He states he takes nasal spray, PTA home meds does not list any nasal spray.  Will question patient further for type of nasal spray and request from MD.

## 2015-09-28 NOTE — Progress Notes (Signed)
Occupational Therapy Treatment Patient Details Name: Bob Christensen MRN: [id number] DOB: Apr 13, 1941 Today's Date: 09/28/2015    History of present illness Pt 74 yr old gentleman that was admitted for TKR on L on 09/27/15   OT comments  LB dressing using AE ( reacher and sockaid ) was address in session - mod A  Infor provided where to get new AE - reacher not working anymore per pt  Ambulate to BR needed min to mod v/c for safety and clothing management -CG for mobility    Follow Up Recommendations  Home health OT    Equipment Recommendations  3 in 1 bedside comode    Recommendations for Other Services PT consult    Precautions / Restrictions Precautions Precautions: Fall Restrictions Weight Bearing Restrictions: Yes LLE Weight Bearing: Weight bearing as tolerated Other Position/Activity Restrictions: Can do straight leg lift - knee immobilizer d/c       Mobility Bed Mobility                  Transfers                      Balance                                   ADL                                                Vision                     Perception     Praxis      Cognition   Behavior During Therapy: WFL for tasks assessed/performed Overall Cognitive Status: Within Functional Limits for tasks assessed                       Extremity/Trunk Assessment               Exercises Other Exercises Other Exercises: Pt need mod A for LB dressing using AE(reacher and sockaid)  - do have AE from prior Hip surgery - but need new reacher ; sit <> stand CG with min v/c for hand placement  Other Exercises: Ambulate to BR with CG - no LOB using RW and BSC over toilet  - toileting hygiene S , clothing management CG wtih min v/c    Shoulder Instructions       General Comments      Pertinent Vitals/ Pain       Pain Assessment: 0-10 Pain Score: 3  Pain Location: R knee Pain  Intervention(s): Repositioned;Ice applied;Limited activity within patient's tolerance  Home Living Family/patient expects to be discharged to:: Private residence Living Arrangements: Alone Available Help at Discharge: Family;Available 24 hours/day Type of Home: House Home Access: Stairs to enter     Home Layout: One level;Other (Comment)     Bathroom Shower/Tub: Walk-in shower         Home Equipment: Walker - standard          Prior Functioning/Environment Level of Independence: Independent        Comments: ind in ADL's and IADl's - and community  activities   Frequency Min 1X/week     Progress Toward Goals  OT Goals(current goals can  now be found in the care plan section)     Acute Rehab OT Goals Patient Stated Goal: to return home OT Goal Formulation: With patient Time For Goal Achievement: 10/11/15 Potential to Achieve Goals: Good  Plan      Co-evaluation                 End of Session Equipment Utilized During Treatment: Gait belt   Activity Tolerance Patient tolerated treatment well   Patient Left in chair;with chair alarm set;with call bell/phone within reach   Nurse Communication          Time: [id number] OT Time Calculation (min): 40 min  Charges: OT General Charges $OT Visit: 1 Procedure OT Evaluation $Initial OT Evaluation Tier I: 1 Procedure OT Treatments $Self Care/Home Management : 8-22 mins  Alichia Alridge 09/28/2015, 11:21 AM

## 2015-09-28 NOTE — Progress Notes (Addendum)
Pt complains of pain frequently this shift. States he previously took Percocet 7.5mg  TID at home. New orders obtained for breakthrough pain. Pt was able to get up to chair with PT today for approx. 2 hours in AM, then for approx 2 hours after lunch. Appetite good. Encouraged use of incentive spirometer and H2O intake. Educated on lovenox injections at home. Pt sister present for education. Plan to d/c home alone, but family to assist as needed, granddaughter/niece to stay with pt and will assist with injections.

## 2015-09-28 NOTE — Progress Notes (Signed)
Physical Therapy Treatment Patient Details Name: Bob Christensen MRN: [id number] DOB: 1940/12/04 Today's Date: 09/28/2015    History of Present Illness Pt 74 yr old gentleman that was admitted for TKR on L on 09/27/15    PT Comments    Pt with good tolerance to session, performed bed mobility with supervision with add'l time noted d/t guarding of knee.  Performed supine and seated therex with good control and tolerance.  Transferred to recliner chair with CGA.  He would con't to benefit from skilled PT to increase LE strength and increase functional mobility tolerance.   Follow Up Recommendations  Home health PT     Equipment Recommendations       Recommendations for Other Services       Precautions / Restrictions Precautions Precautions: Fall Restrictions Weight Bearing Restrictions: Yes LLE Weight Bearing: Weight bearing as tolerated Other Position/Activity Restrictions: Can do straight leg lift - knee immobilizer d/c    Mobility  Bed Mobility Overal bed mobility: Needs Assistance Bed Mobility: Supine to Sit     Supine to sit: Supervision     General bed mobility comments: Pt with noted guarding and slow movements  Transfers Overall transfer level: Needs assistance Equipment used: Rolling walker (2 wheeled) Transfers: Sit to/from Stand Sit to Stand: Min guard         General transfer comment: Pt ambulated x4 ft to chair  Ambulation/Gait Ambulation/Gait assistance: Min guard Ambulation Distance (Feet): 4 Feet Assistive device: Rolling walker (2 wheeled) Gait Pattern/deviations: Step-through pattern     General Gait Details: Pt with slow guarded movement   Stairs            Wheelchair Mobility    Modified Rankin (Stroke Patients Only)       Balance Overall balance assessment: No apparent balance deficits (not formally assessed)                                  Cognition Arousal/Alertness: Awake/alert Behavior During  Therapy: WFL for tasks assessed/performed Overall Cognitive Status: Within Functional Limits for tasks assessed                      Exercises Other Exercises Other Exercises: Pt performed supine SLR, QS, ankle pumps x 10, sitting, LAQ, hip abd hip flexion x 10  Other Exercises: Ambulate to BR with CG - no LOB using RW and BSC over toilet  - toileting hygiene S , clothing management CG wtih min v/c     General Comments        Pertinent Vitals/Pain Pain Assessment: 0-10 Pain Score: 5  Pain Location: R knee Pain Intervention(s): Limited activity within patient's tolerance    Home Living Family/patient expects to be discharged to:: Private residence Living Arrangements: Alone Available Help at Discharge: Family;Available 24 hours/day Type of Home: House Home Access: Stairs to enter   Home Layout: One level;Other (Comment) Home Equipment: Walker - standard      Prior Function Level of Independence: Independent      Comments: ind in ADL's and IADl's - and community  activities   PT Goals (current goals can now be found in the care plan section) Acute Rehab PT Goals Patient Stated Goal: to return home PT Goal Formulation: With patient Time For Goal Achievement: 10/11/15 Potential to Achieve Goals: Good    Frequency  BID    PT Plan      Co-evaluation  End of Session Equipment Utilized During Treatment: Gait belt Activity Tolerance: Patient tolerated treatment well Patient left: in chair     Time: 0920-0950 PT Time Calculation (min) (ACUTE ONLY): 30 min  Charges:  $Therapeutic Exercise: 8-22 mins $Therapeutic Activity: 8-22 mins                    G Codes:      Dov Dill 2015-10-17, 1:28 PM Keyerra Lamere, PTA

## 2015-09-29 LAB — CBC
HCT: 27.2 % — ABNORMAL LOW (ref 40.0–52.0)
HEMOGLOBIN: 8.9 g/dL — AB (ref 13.0–18.0)
MCH: 31.4 pg (ref 26.0–34.0)
MCHC: 32.7 g/dL (ref 32.0–36.0)
MCV: 96.2 fL (ref 80.0–100.0)
PLATELETS: 133 10*3/uL — AB (ref 150–440)
RBC: 2.83 MIL/uL — AB (ref 4.40–5.90)
RDW: 14.6 % — ABNORMAL HIGH (ref 11.5–14.5)
WBC: 7.7 10*3/uL (ref 3.8–10.6)

## 2015-09-29 LAB — BASIC METABOLIC PANEL
Anion gap: 6 (ref 5–15)
BUN: 29 mg/dL — AB (ref 6–20)
CHLORIDE: 103 mmol/L (ref 101–111)
CO2: 28 mmol/L (ref 22–32)
CREATININE: 1.14 mg/dL (ref 0.61–1.24)
Calcium: 8.2 mg/dL — ABNORMAL LOW (ref 8.9–10.3)
Glucose, Bld: 141 mg/dL — ABNORMAL HIGH (ref 65–99)
POTASSIUM: 3.9 mmol/L (ref 3.5–5.1)
SODIUM: 137 mmol/L (ref 135–145)

## 2015-09-29 MED ORDER — OXYCODONE HCL 5 MG PO TABS: 5.0000 mg | ORAL_TABLET | ORAL | Status: DC | PRN

## 2015-09-29 MED ORDER — ENOXAPARIN SODIUM 30 MG/0.3ML ~~LOC~~ SOLN: 30.0000 mg | SUBCUTANEOUS | Status: DC

## 2015-09-29 MED ORDER — BISACODYL 10 MG RE SUPP
10.0000 mg | Freq: Every day | RECTAL | Status: DC | PRN
  Administered 2015-09-29: 10 mg via RECTAL
  Filled 2015-09-29: qty 1

## 2015-09-29 MED ORDER — TRAMADOL HCL 50 MG PO TABS: 50.0000 mg | ORAL_TABLET | ORAL | Status: DC | PRN

## 2015-09-29 MED ORDER — MAGNESIUM HYDROXIDE 400 MG/5ML PO SUSP: 30.0000 mL | Freq: Four times a day (QID) | ORAL | Status: DC | PRN

## 2015-09-29 NOTE — Progress Notes (Signed)
  Subjective: 2 Days Post-Op Procedure(s) (LRB): TOTAL KNEE ARTHROPLASTY (Left) Patient reports pain as mild.   Patient seen in rounds with Dr. Jefm Bryant. Patient is well, and has had no acute complaints or problems Plan is to go Home after hospital stay. Negative for chest pain and shortness of breath Fever: no Gastrointestinal: Negative for nausea and vomiting  Objective: Vital signs in last 24 hours: Temp:  [98.4 F (36.9 C)-99.8 F (37.7 C)] 98.4 F (36.9 C) (11/18 0446) Pulse Rate:  [67-85] 67 (11/18 0446) Resp:  [18] 18 (11/18 0446) BP: (144-169)/(45-62) 145/45 mmHg (11/18 0446) SpO2:  [95 %-99 %] 95 % (11/18 0446)  Intake/Output from previous day:  Intake/Output Summary (Last 24 hours) at 09/29/15 0611 Last data filed at 09/29/15 0555  Gross per 24 hour  Intake    240 ml  Output   1275 ml  Net  -1035 ml    Intake/Output this shift: Total I/O In: -  Out: 950 [Urine:950]  Labs:  Recent Labs  09/27/15 1320 09/28/15 0410 09/29/15 0352  HGB 10.2* 9.4* 8.9*    Recent Labs  09/28/15 0410 09/29/15 0352  WBC 6.8 7.7  RBC 2.98* 2.83*  HCT 28.7* 27.2*  PLT 127* 133*    Recent Labs  09/28/15 0410 09/29/15 0352  NA 138 137  K 4.2 3.9  CL 107 103  CO2 27 28  BUN 22* 29*  CREATININE 0.93 1.14  GLUCOSE 150* 141*  CALCIUM 8.0* 8.2*   No results for input(s): LABPT, INR in the last 72 hours.   EXAM General - Patient is Alert and Oriented Extremity - Sensation intact distally Dorsiflexion/Plantar flexion intact No cellulitis present Compartment soft Dressing/Incision - clean, dry, no drainage Motor Function - intact, moving foot and toes well on exam. The patient is able to do a straight leg raise. He ambulated 50 feet.  Past Medical History  Diagnosis Date  . History of colon polyps 2012    a. Tubular adenoma excised by Tiffany Kocher  . History of kidney stones   . Osteoarthritis   . Brachial neuritis   . GI bleed     a. Following polypectomy  .  Biceps tendon rupture 2015  . Malignant melanoma of skin of scalp ([place]) 2011    a. Excised 2001 [facility] [place] [facility])  . PAF (paroxysmal atrial fibrillation) ([place])     a. 1997 - no known recurrence since;  b. CHA2DS2VASc = 4.  . Carotid arterial disease ([place])     a. 07/2014 Carotid U/S: bilat 0-39% stenosis.  . Degenerative joint disease     a. 2010 s/p L THA;  b. 2016 - pending L knee arthroplasty.  . H/O cardiac catheterization     a. 1997 - reportedly normal.  . Essential hypertension   . Diet-controlled diabetes mellitus ([place])   . Diabetes mellitus without complication ([facility])     Assessment/Plan: 2 Days Post-Op Procedure(s) (LRB): TOTAL KNEE ARTHROPLASTY (Left) Active Problems:   S/P total knee replacement  Estimated body mass index is 28.81 kg/(m^2) as calculated from the following:   Height as of this encounter: 5\' 7"  (1.702 m).   Weight as of this encounter: 83.462 kg (184 lb). Plan for discharge tomorrow  DVT Prophylaxis - Lovenox, Foot Pumps and TED hose Weight-Bearing as tolerated to left leg  Reche Dixon, PA-C Orthopaedic Surgery 09/29/2015, 6:11 AM

## 2015-09-29 NOTE — Progress Notes (Signed)
Physical Therapy Treatment Patient Details Name: Bob Christensen MRN: [id number] DOB: Jan 10, 1941 Today's Date: 09/29/2015    History of Present Illness Pt 74 yr old gentleman that was admitted for TKR on L on 09/27/15    PT Comments    Pt is able to ambulate around the nurses' station with guarded, but consistent ambulation.  He was able to do SLR with only minimal quad lag and shows good effort with all bed exercises.  Pt continues to c/o expected pain with ROM acts but tolerates well and again gets close to 90 degrees.   Follow Up Recommendations  Home health PT     Equipment Recommendations       Recommendations for Other Services       Precautions / Restrictions Precautions Precautions: Fall Restrictions LLE Weight Bearing: Weight bearing as tolerated    Mobility  Bed Mobility Overal bed mobility: Modified Independent Bed Mobility: Supine to Sit;Sit to Supine     Supine to sit: Supervision Sit to supine: Supervision   General bed mobility comments: Pt was able to get to EOB and then back into bed with no direct assist and he was less reliant on the bed rails this session  Transfers Overall transfer level: Needs assistance Equipment used: Rolling walker (2 wheeled) Transfers: Sit to/from Stand Sit to Stand: Min guard         General transfer comment: Pt did well getting up to standing, he did need reminders for hand placement when returning to sitting but did stay safe and protect L knee well.  Ambulation/Gait Ambulation/Gait assistance: Supervision Ambulation Distance (Feet): 200 Feet Assistive device: Rolling walker (2 wheeled)       General Gait Details: Pt shows better ability to straighten and bend knee in a more appropriate pattern (w/ less VCs) with heel strike and swing through.  He has only minimal fatigue and no signficant safety issues.    Stairs            Wheelchair Mobility    Modified Rankin (Stroke Patients Only)        Balance                                    Cognition Arousal/Alertness: Awake/alert Behavior During Therapy: WFL for tasks assessed/performed Overall Cognitive Status: Within Functional Limits for tasks assessed                      Exercises Total Joint Exercises Ankle Circles/Pumps: AROM;10 reps Quad Sets: Strengthening;10 reps Gluteal Sets: Strengthening;10 reps Short Arc Quad: AROM;Strengthening;10 reps Heel Slides: AAROM;AROM;10 reps Hip ABduction/ADduction: AROM;10 reps Straight Leg Raises: AROM;15 reps (5 AAROM warm up reps) Knee Flexion: PROM;5 reps    General Comments        Pertinent Vitals/Pain Pain Score: 6  Pain Location: reports more soreness pain in the popliteal fossa    Home Living                      Prior Function            PT Goals (current goals can now be found in the care plan section) Progress towards PT goals: Progressing toward goals    Frequency  BID    PT Plan Current plan remains appropriate    Co-evaluation             End of Session Equipment  Utilized During Treatment: Gait belt Activity Tolerance: Patient tolerated treatment well Patient left: with bed alarm set;with call bell/phone within reach     Time: 1417-1458 PT Time Calculation (min) (ACUTE ONLY): 41 min  Charges:  $Gait Training: 8-22 mins $Therapeutic Exercise: 8-22 mins $Therapeutic Activity: 8-22 mins                    G Codes:     Bob Christensen, PT, DPT [phone]  Bob Christensen 09/29/2015, 3:15 PM

## 2015-09-29 NOTE — Discharge Instructions (Signed)

## 2015-09-29 NOTE — Progress Notes (Signed)
Patient is afebrile. Hemoglobin is 8.9. Potassium is 3.9. Patient has become a little more comfortable. His dressing is to be changed. He is gradually meeting his physical therapy goals. He should be discharged tomorrow to be followed by home health physical therapy. He will  be taking oxycodone for pain. He will be continuing his Lovenox at home for about another 10 days. He will need to return to the Orthopedic Department at [facility] in [place] in about 10 days. He will need to keep his knee dressing dry.

## 2015-09-29 NOTE — Progress Notes (Signed)
Patient still needs to have bowel movement and only miralax was ordered.  Patient states miralax does not work for me and refused.  Called [place], [place] and got orders for suppository and milk of mag.  Will start bowel medications.

## 2015-09-29 NOTE — Progress Notes (Signed)
Physical Therapy Treatment Patient Details Name: Bob Christensen MRN: [id number] DOB: 01/04/1941 Today's Date: 09/29/2015    History of Present Illness Pt 74 yr old gentleman that was admitted for TKR on L on 09/27/15    PT Comments    Pt does well with PT but does c/o pain most of the time.  He is able to get to mid 80s with flexion, shows improved quad control and and walks ~85 ft with walker.  He keeps the knee bent and relatively stiff but shows good effort and does improve cadence with cuing.    Follow Up Recommendations  Home health PT     Equipment Recommendations       Recommendations for Other Services       Precautions / Restrictions Precautions Precautions: Fall Restrictions Weight Bearing Restrictions: Yes LLE Weight Bearing: Weight bearing as tolerated    Mobility  Bed Mobility Overal bed mobility: Needs Assistance Bed Mobility: Supine to Sit     Supine to sit: Min guard;Min assist     General bed mobility comments: Pt continues to show good effort with bed mobility, does need some assist.  Transfers Overall transfer level: Needs assistance Equipment used: Rolling walker (2 wheeled) Transfers: Sit to/from Stand Sit to Stand: Min guard         General transfer comment: Pt does well with getting to standing from EOB with minimal guard  Ambulation/Gait Ambulation/Gait assistance: Min guard Ambulation Distance (Feet): 85 Feet Assistive device: Rolling walker (2 wheeled)       General Gait Details: Pt with relatively stiff L knee posturing, but with cuing is able to normalize knee motion somewhat.  She is still hesitant and guarded, but ultimate is safe.   Stairs            Wheelchair Mobility    Modified Rankin (Stroke Patients Only)       Balance                                    Cognition Arousal/Alertness: Awake/alert Behavior During Therapy: WFL for tasks assessed/performed Overall Cognitive Status: Within  Functional Limits for tasks assessed                      Exercises Total Joint Exercises Ankle Circles/Pumps: AROM;10 reps Quad Sets: Strengthening;10 reps Gluteal Sets: Strengthening;10 reps Short Arc Quad: AROM;Strengthening;10 reps Heel Slides: AAROM;AROM;10 reps Hip ABduction/ADduction: AROM;10 reps Knee Flexion: PROM;5 reps Goniometric ROM: 2-86    General Comments        Pertinent Vitals/Pain Pain Score: 7     Home Living                      Prior Function            PT Goals (current goals can now be found in the care plan section) Progress towards PT goals: Progressing toward goals    Frequency  BID    PT Plan Current plan remains appropriate    Co-evaluation             End of Session Equipment Utilized During Treatment: Gait belt Activity Tolerance: Patient tolerated treatment well Patient left: with chair alarm set     Time: [id number] PT Time Calculation (min) (ACUTE ONLY): 25 min  Charges:  $Gait Training: 8-22 mins $Therapeutic Exercise: 8-22 mins  G Codes:     Bob Christensen, PT, DPT [phone]  Bob Christensen 09/29/2015, 11:30 AM

## 2015-09-29 NOTE — Progress Notes (Signed)
Occupational Therapy Treatment Patient Details Name: MAHMOUD MOHAR MRN: [id number] DOB: 1941/10/06 Today's Date: 09/29/2015    History of present illness Pt 74 yr old gentleman that was admitted for TKR on L on 09/27/15   OT comments  Patient with increased pain in the afternoon and had already completed PT.  Familiar with adaptive equipment from hip surgery in the past.  Patient seen for instruction on adaptive equipment for lower body dressing, minimal assist.  Declines toileting this date.  Has a sock aid at home which he made in the past with PVC pipe.  Repositioned in bed for comfort, polar care applied, bed alarm on and call bell in reach.  Continue OT to increase independence and safety with daily tasks.  Follow Up Recommendations  Home health OT    Equipment Recommendations  3 in 1 bedside comode    Recommendations for Other Services      Precautions / Restrictions Precautions Precautions: Fall Restrictions Weight Bearing Restrictions: Yes LLE Weight Bearing: Weight bearing as tolerated       Mobility Bed Mobility Overal bed mobility: Modified Independent Bed Mobility: Supine to Sit;Sit to Supine     Supine to sit: Supervision Sit to supine: Supervision    Transfers Overall transfer level: Needs assistance Equipment used: Rolling walker (2 wheeled) Transfers: Sit to/from Stand Sit to Stand: Min guard             Balance                                   ADL Overall ADL's : Needs assistance/impaired Eating/Feeding: Independent   Grooming: Set up               Lower Body Dressing: Minimal assistance;With adaptive equipment                        Vision                     Perception     Praxis      Cognition   Behavior During Therapy: [facility] for tasks assessed/performed Overall Cognitive Status: Within Functional Limits for tasks assessed                       Extremity/Trunk Assessment                Exercises   Shoulder Instructions       General Comments      Pertinent Vitals/ Pain       Pain Assessment: 0-10 Pain Score: 6  Pain Location: left knee Pain Descriptors / Indicators: Aching Pain Intervention(s): Limited activity within patient's tolerance;Monitored during session  Home Living                                          Prior Functioning/Environment              Frequency Min 1X/week     Progress Toward Goals  OT Goals(current goals can now be found in the care plan section)  Progress towards OT goals: Progressing toward goals  Acute Rehab OT Goals Patient Stated Goal: to return home OT Goal Formulation: With patient Time For Goal Achievement: 10/11/15 Potential to Achieve Goals: Good  Plan Discharge plan  remains appropriate    Co-evaluation                 End of Session Equipment Utilized During Treatment: Gait belt   Activity Tolerance Patient tolerated treatment well   Patient Left in bed;with call bell/phone within reach;with bed alarm set   Nurse Communication          Time: [id number] OT Time Calculation (min): 17 min  Charges: OT General Charges $OT Visit: 1 Procedure OT Treatments $Self Care/Home Management : 8-22 mins Daquana Paddock T Harlo Jaso, OTR/L, CLT Osher Oettinger 09/29/2015, 3:44 PM

## 2015-09-29 NOTE — Discharge Summary (Signed)
Physician Discharge Summary  Subjective: 2 Days Post-Op Procedure(s) (LRB): TOTAL KNEE ARTHROPLASTY (Left) Patient reports pain as mild.   Patient seen in rounds with Dr. Jefm Bryant. Patient is well, and has had no acute complaints or problems Patient is ready to go Home tomorrow  Physician Discharge Summary  Patient ID: Bob Christensen MRN: [id number] DOB/AGE: [birth year] 74 y.o.  Admit date: 09/27/2015 Discharge date: 09/30/2015  Admission Diagnoses:  Discharge Diagnoses:  Active Problems:   S/P total knee replacement   Discharged Condition: fair  Hospital Course: The patient is status post left total knee replacement done on 09/27/2015. He is slowly improving with pain control. Well with physical therapy on postop day 1 ambulating 50 feet. The patient is more comfortable now that his pain control and able to work with therapy to go home.  Treatments: surgery:  PROCEDURE: Left total knee arthroplasty  SURGEON: Dr.Harold Leeanne Mannan. M.D.  ASSISTANT: Reche Dixon, PA-C  ANESTHESIA: Spinal  ESTIMATED BLOOD LOSS: 200 mL  TOURNIQUET TIME: 96 minutes   IMPLANTS UTILIZED: #7 [employer] Tri[employer]lon posterior stabilized femoral component, #7 9 mm thick tibial bearing insert, #7 tibial baseplate, 38 mm by 11 mm asymmetric patella  Discharge Exam: Blood pressure 145/45, pulse 67, temperature 98.4 F (36.9 C), temperature source Oral, resp. rate 18, height 5\' 7"  (1.702 m), weight 83.462 kg (184 lb), SpO2 95 %.   Disposition:      Medication List    STOP taking these medications        oxyCODONE-acetaminophen 7.5-325 MG tablet  Commonly known as:  PERCOCET      TAKE these medications        allopurinol 100 MG tablet  Commonly known as:  ZYLOPRIM  Take 200 mg by mouth every morning.     aspirin 81 MG EC tablet  Take 81 mg by mouth daily.     CENTRUM SILVER PO  Take 1 tablet by mouth daily.     diltiazem 60 MG tablet  Commonly known as:  CARDIZEM  Take 1 tablet  (60 mg total) by mouth 3 (three) times daily.     enoxaparin 30 MG/0.3ML injection  Commonly known as:  LOVENOX  Inject 0.3 mLs (30 mg total) into the skin daily.     finasteride 5 MG tablet  Commonly known as:  PROSCAR  Take 5 mg by mouth daily.     gabapentin 600 MG tablet  Commonly known as:  NEURONTIN  Take 600 mg by mouth 4 (four) times daily.     GLUCOSAMINE-CHONDROITIN-VIT C PO  Take 1 capsule by mouth daily.     LIPITOR 20 MG tablet  Generic drug:  atorvastatin  Take 20 mg by mouth every morning.     lisinopril-hydrochlorothiazide 20-12.5 MG tablet  Commonly known as:  PRINZIDE,ZESTORETIC  Take 1 tablet by mouth daily.     Omega 3 340 MG Cpdr  Take 340 mg by mouth daily.     oxyCODONE 5 MG immediate release tablet  Commonly known as:  Oxy IR/ROXICODONE  Take 1-2 tablets (5-10 mg total) by mouth every 4 (four) hours as needed for breakthrough pain.     PREVACID 24HR 15 MG capsule  Generic drug:  lansoprazole  Take 15 mg by mouth daily.     tamsulosin 0.4 MG Caps capsule  Commonly known as:  FLOMAX  Take 0.4 mg by mouth every morning.     traMADol 50 MG tablet  Commonly known as:  ULTRAM  Take 1-2 tablets (50-100  mg total) by mouth every 4 (four) hours as needed for moderate pain (mild to moderate pain).           Follow-up Information    Follow up with Vilinda Flake, MD In 2 weeks.   Specialty:  Orthopedic Surgery   Why:  For staple removal   Contact information:   [place] [address] [phone]       Signed: Prescott Parma, Kennethia Lynes 09/29/2015, 6:16 AM   Objective: Vital signs in last 24 hours: Temp:  [98.4 F (36.9 C)-99.8 F (37.7 C)] 98.4 F (36.9 C) (11/18 0446) Pulse Rate:  [67-85] 67 (11/18 0446) Resp:  [18] 18 (11/18 0446) BP: (144-169)/(45-62) 145/45 mmHg (11/18 0446) SpO2:  [95 %-99 %] 95 % (11/18 0446)  Intake/Output from previous day:  Intake/Output Summary (Last 24 hours) at 09/29/15 0616 Last data filed  at 09/29/15 0555  Gross per 24 hour  Intake    240 ml  Output   1275 ml  Net  -1035 ml    Intake/Output this shift: Total I/O In: -  Out: 950 [Urine:950]  Labs:  Recent Labs  09/27/15 1320 09/28/15 0410 09/29/15 0352  HGB 10.2* 9.4* 8.9*    Recent Labs  09/28/15 0410 09/29/15 0352  WBC 6.8 7.7  RBC 2.98* 2.83*  HCT 28.7* 27.2*  PLT 127* 133*    Recent Labs  09/28/15 0410 09/29/15 0352  NA 138 137  K 4.2 3.9  CL 107 103  CO2 27 28  BUN 22* 29*  CREATININE 0.93 1.14  GLUCOSE 150* 141*  CALCIUM 8.0* 8.2*   No results for input(s): LABPT, INR in the last 72 hours.  EXAM: General - Patient is Alert and Oriented Extremity - Sensation intact distally Dorsiflexion/Plantar flexion intact No cellulitis present Compartment soft Incision - clean, dry, no drainage Motor Function -  the patient is able do a straight leg raise. He can dorsiflex and plantarflex his ankle. The patient is ambulating with physical therapy.  Assessment/Plan: 2 Days Post-Op Procedure(s) (LRB): TOTAL KNEE ARTHROPLASTY (Left) Procedure(s) (LRB): TOTAL KNEE ARTHROPLASTY (Left) Past Medical History  Diagnosis Date  . History of colon polyps 2012    a. Tubular adenoma excised by Tiffany Kocher  . History of kidney stones   . Osteoarthritis   . Brachial neuritis   . GI bleed     a. Following polypectomy  . Biceps tendon rupture 2015  . Malignant melanoma of skin of scalp ([place]) 2011    a. Excised 2001 [facility] [place] Thereasa Solo)  . PAF (paroxysmal atrial fibrillation) ([place])     a. 1997 - no known recurrence since;  b. CHA2DS2VASc = 4.  . Carotid arterial disease ([place])     a. 07/2014 Carotid U/S: bilat 0-39% stenosis.  . Degenerative joint disease     a. 2010 s/p L THA;  b. 2016 - pending L knee arthroplasty.  . H/O cardiac catheterization     a. 1997 - reportedly normal.  . Essential hypertension   . Diet-controlled diabetes mellitus ([place])   . Diabetes mellitus without complication  (HCC)    Active Problems:   S/P total knee replacement  Estimated body mass index is 28.81 kg/(m^2) as calculated from the following:   Height as of this encounter: 5\' 7"  (1.702 m).   Weight as of this encounter: 83.462 kg (184 lb). Discharge home with home health Diet - Regular diet Follow up - in 2 weeks Activity - WBAT Disposition - Home Condition Upon Discharge -  Good DVT Prophylaxis - Lovenox and TED hose  Reche Dixon, PA-C Orthopaedic Surgery 09/29/2015, 6:16 AM

## 2015-09-30 LAB — CBC
HEMATOCRIT: 25.5 % — AB (ref 40.0–52.0)
HEMOGLOBIN: 8.4 g/dL — AB (ref 13.0–18.0)
MCH: 31.6 pg (ref 26.0–34.0)
MCHC: 32.8 g/dL (ref 32.0–36.0)
MCV: 96.3 fL (ref 80.0–100.0)
Platelets: 135 10*3/uL — ABNORMAL LOW (ref 150–440)
RBC: 2.65 MIL/uL — ABNORMAL LOW (ref 4.40–5.90)
RDW: 14.6 % — AB (ref 11.5–14.5)
WBC: 6.9 10*3/uL (ref 3.8–10.6)

## 2015-09-30 NOTE — Care Management (Addendum)
Patient had previously picked [facility] and I have notified them of patient discharge home today. Advised Lyn RNCM to cancel order to [facility] so there would not be any confusion to discharge- patient does not hear well as I was told by his sister when patient selected [place]. Lovenox cost $28.36- patient updated. Lyn RNCM said she cancelled [place] referral.

## 2015-09-30 NOTE — Progress Notes (Signed)
Physical Therapy Treatment Patient Details Name: Bob Christensen MRN: [id number] DOB: January 14, 1941 Today's Date: 09/30/2015    History of Present Illness Pt 74 yr old gentleman that was admitted for TKR on L on 09/27/15    PT Comments    Pt continues to do well with PT and despite pain in knee and issues with UE sensation/strength he generally does well and shows good effort and consistent gains.  Pt able to do SLRs, has >90 degrees of flexion and circumambulate the nurses' station with consistent cadence though he does still lack TKE with heel strike.  Follow Up Recommendations  Home health PT     Equipment Recommendations       Recommendations for Other Services       Precautions / Restrictions Precautions Precautions: Fall Restrictions Weight Bearing Restrictions: Yes LLE Weight Bearing: Weight bearing as tolerated    Mobility  Bed Mobility Overal bed mobility: Modified Independent       Supine to sit: Supervision     General bed mobility comments: Pt shows good ability to move L LE off EOB w/o assist  Transfers Overall transfer level: Independent Equipment used: Rolling walker (2 wheeled) Transfers: Sit to/from Stand Sit to Stand: Modified independent (Device/Increase time)         General transfer comment: Pt just needing minimal VCs for hand placement and sequencing  Ambulation/Gait Ambulation/Gait assistance: Supervision Ambulation Distance (Feet): 200 Feet Assistive device: Rolling walker (2 wheeled)       General Gait Details: Pt with more consistent heel strike (still lacking some TKE) but generally has increased confidence and speed   Stairs            Wheelchair Mobility    Modified Rankin (Stroke Patients Only)       Balance                                    Cognition Arousal/Alertness: Awake/alert Behavior During Therapy: WFL for tasks assessed/performed Overall Cognitive Status: Within Functional Limits  for tasks assessed                      Exercises Total Joint Exercises Ankle Circles/Pumps: AROM;10 reps Quad Sets: Strengthening;15 reps Gluteal Sets: 15 reps;Strengthening Short Arc Quad: AROM;Strengthening;10 reps Heel Slides: AAROM;AROM;10 reps Hip ABduction/ADduction: AROM;10 reps Straight Leg Raises: AROM;15 reps Goniometric ROM: 2-93 Other Exercises Other Exercises: `    General Comments        Pertinent Vitals/Pain Pain Score: 7     Home Living Family/patient expects to be discharged to:: Private residence                    Prior Function            PT Goals (current goals can now be found in the care plan section) Progress towards PT goals: Progressing toward goals    Frequency  BID    PT Plan Current plan remains appropriate    Co-evaluation             End of Session Equipment Utilized During Treatment: Gait belt Activity Tolerance: Patient tolerated treatment well Patient left: with bed alarm set;with call bell/phone within reach     Time: 0755-0820 PT Time Calculation (min) (ACUTE ONLY): 25 min  Charges:  $Gait Training: 8-22 mins $Therapeutic Exercise: 8-22 mins  G Codes:     Bob Christensen, PT, DPT [phone]  Bob Christensen 09/30/2015, 10:16 AM

## 2015-09-30 NOTE — Care Management Note (Deleted)
Case Management Note  Patient Details  Name: KARDER SWASEY MRN: [id number] Date of Birth: March 07, 1941  Subjective/Objective:    Mr Geis chose [facility] from a list of area providers. A referral was faxed and called to [facility] requesting home health PT. Mr Poore was advised that someone from [place] would call him to schedule an appointment within 48 hours of his hospital discharge.                 Action/Plan:   Expected Discharge Date:                  Expected Discharge Plan:     In-House Referral:     Discharge planning Services  CM Consult  Post Acute Care Choice:  Home Health Choice offered to:  Patient  DME Arranged:    DME Agency:     HH Arranged:    [place] Agency:     Status of Service:  In process, will continue to follow  Medicare Important Message Given:    Date Medicare IM Given:    Medicare IM give by:    Date Additional Medicare IM Given:    Additional Medicare Important Message give by:     If discussed at [place] of Stay Meetings, dates discussed:    Additional Comments:  Nick Stults A, RN 09/30/2015, 8:36 AM

## 2015-09-30 NOTE — Progress Notes (Signed)
Little confusion tonight. Not able to demonstrate Lovenox administration.

## 2015-09-30 NOTE — Progress Notes (Signed)
   Subjective: 3 Days Post-Op Procedure(s) (LRB): TOTAL KNEE ARTHROPLASTY (Left) Patient reports pain as mild.   Patient is well, but has had some minor complaints of left carpal tunnel symptoms. We will start therapy today.  Plan is to go Home after hospital stay.  Objective: Vital signs in last 24 hours: Temp:  [98.3 F (36.8 C)-99.5 F (37.5 C)] 98.5 F (36.9 C) (11/19 0715) Pulse Rate:  [72-89] 72 (11/19 0715) Resp:  [17-20] 17 (11/19 0715) BP: (94-152)/(38-54) 118/48 mmHg (11/19 0715) SpO2:  [92 %-100 %] 93 % (11/19 0715)  Intake/Output from previous day: 11/18 0701 - 11/19 0700 In: -  Out: 550 [Urine:550] Intake/Output this shift:     Recent Labs  09/27/15 1320 09/28/15 0410 09/29/15 0352 09/30/15 0358  HGB 10.2* 9.4* 8.9* 8.4*    Recent Labs  09/29/15 0352 09/30/15 0358  WBC 7.7 6.9  RBC 2.83* 2.65*  HCT 27.2* 25.5*  PLT 133* 135*    Recent Labs  09/28/15 0410 09/29/15 0352  NA 138 137  K 4.2 3.9  CL 107 103  CO2 27 28  BUN 22* 29*  CREATININE 0.93 1.14  GLUCOSE 150* 141*  CALCIUM 8.0* 8.2*   No results for input(s): LABPT, INR in the last 72 hours.  EXAM General - Patient is Alert, Appropriate and Oriented Extremity - Neurovascular intact Sensation intact distally Intact pulses distally Dorsiflexion/Plantar flexion intact Dressing - dressing C/D/I and no drainage Motor Function - intact, moving foot and toes well on exam.   Past Medical History  Diagnosis Date  . History of colon polyps 2012    a. Tubular adenoma excised by Tiffany Kocher  . History of kidney stones   . Osteoarthritis   . Brachial neuritis   . GI bleed     a. Following polypectomy  . Biceps tendon rupture 2015  . Malignant melanoma of skin of scalp ([place]) 2011    a. Excised 2001 [facility] [place] Thereasa Solo)  . PAF (paroxysmal atrial fibrillation) ([place])     a. 1997 - no known recurrence since;  b. CHA2DS2VASc = 4.  . Carotid arterial disease ([place])     a. 07/2014  Carotid U/S: bilat 0-39% stenosis.  . Degenerative joint disease     a. 2010 s/p L THA;  b. 2016 - pending L knee arthroplasty.  . H/O cardiac catheterization     a. 1997 - reportedly normal.  . Essential hypertension   . Diet-controlled diabetes mellitus ([place])   . Diabetes mellitus without complication (HCC)     Assessment/Plan:   3 Days Post-Op Procedure(s) (LRB): TOTAL KNEE ARTHROPLASTY (Left) Active Problems:   S/P total knee replacement  Estimated body mass index is 28.81 kg/(m^2) as calculated from the following:   Height as of this encounter: 5\' 7"  (1.702 m).   Weight as of this encounter: 184 lb (83.462 kg). Advance diet Up with therapy  Discharge home with home health.  Follow up with [facility] in 2 weeks.   DVT Prophylaxis - Lovenox, Foot Pumps and TED hose Weight-Bearing as tolerated to left leg D/C O2 and Pulse OX and try on Room Air  T. Rachelle Hora, PA-C [facility] 09/30/2015, 7:59 AM

## 2015-10-03 ENCOUNTER — Encounter: Payer: Medicare Other | Admitting: Family Medicine

## 2015-10-25 ENCOUNTER — Telehealth: Payer: Self-pay | Admitting: Family Medicine

## 2015-10-25 NOTE — Telephone Encounter (Signed)
Pt contacted office for refill request on the following medications:  Percocet 7.5-325.  CB#[phone]/MW

## 2015-10-25 NOTE — Telephone Encounter (Signed)
Pt states he doe not need this refill.  Pt states the hospital had already given him a refill/MW

## 2015-10-27 ENCOUNTER — Ambulatory Visit (INDEPENDENT_AMBULATORY_CARE_PROVIDER_SITE_OTHER): Payer: Medicare Other | Admitting: Family Medicine

## 2015-10-27 ENCOUNTER — Encounter: Payer: Self-pay | Admitting: Family Medicine

## 2015-10-27 VITALS — BP 110/48 | HR 69 | Temp 98.7°F | Resp 16 | Wt 176.0 lb

## 2015-10-27 DIAGNOSIS — M2550 Pain in unspecified joint: Secondary | ICD-10-CM

## 2015-10-27 DIAGNOSIS — E1121 Type 2 diabetes mellitus with diabetic nephropathy: Secondary | ICD-10-CM | POA: Diagnosis not present

## 2015-10-27 DIAGNOSIS — I10 Essential (primary) hypertension: Secondary | ICD-10-CM | POA: Diagnosis not present

## 2015-10-27 DIAGNOSIS — M179 Osteoarthritis of knee, unspecified: Secondary | ICD-10-CM

## 2015-10-27 DIAGNOSIS — M1712 Unilateral primary osteoarthritis, left knee: Secondary | ICD-10-CM

## 2015-10-27 LAB — POCT GLYCOSYLATED HEMOGLOBIN (HGB A1C)
Est. average glucose Bld gHb Est-mCnc: 134
Hemoglobin A1C: 6.3

## 2015-10-27 MED ORDER — LISINOPRIL-HYDROCHLOROTHIAZIDE 10-12.5 MG PO TABS: 1.0000 | ORAL_TABLET | Freq: Every day | ORAL | Status: DC

## 2015-10-27 NOTE — Progress Notes (Signed)
Patient: Bob Christensen Male    DOB: 10/07/1941   74 y.o.   MRN: [id number] Visit Date: 10/27/2015  Today's Provider: Lelon Huh, MD   Chief Complaint  Patient presents with  . Follow-up  . Diabetes  . Hypertension  . Gastroesophageal Reflux   Subjective:    HPI   Follow-up for chronic pain disorder from 05/31/2015; doing well on current pain medications. Had right knee replacement by Dr. Jefm Bryant in November which went well.    Follow-up for GERD from 05/31/2015; no changes.    Diabetes Mellitus Type II, Follow-up:   Lab Results  Component Value Date   HGBA1C 6.7 05/31/2015   HGBA1C 6.5 12/28/2014   HGBA1C 6.6 08/01/2014   Last seen for diabetes 5 months ago.  Management since then includes; no changes were made. He reports good compliance with treatment. He is not having side effects. none Current symptoms include none and have been stable. Home blood sugar records: fasting range: 108  Episodes of hypoglycemia? no   Current Insulin Regimen: n/a Most Recent Eye Exam: 2016 Weight trend: increasing steadily Prior visit with dietician: no Current diet: well balanced Current exercise: none  ----------------------------------------------------------------------   Hypertension, follow-up:  BP Readings from Last 3 Encounters:  10/27/15 110/48  09/30/15 118/48  09/18/15 128/53    He was last seen for hypertension 5 months ago.  BP at that visit was 110/52. Management since that visit includes; no changes were made.He reports good compliance with treatment. He is having side effects. Light-headness  He is not exercising. He is adherent to low salt diet.   Outside blood pressures are 110/48. He is experiencing none.  Patient denies none.   Cardiovascular risk factors include diabetes mellitus.  Use of agents associated with hypertension: none.  He does get a little light headed when he stands quickly.    ----------------------------------------------------------------------       Allergies  Allergen Reactions  . Digoxin Other (See Comments)    "Heart stop.  Shocked my heart"   Previous Medications   ALLOPURINOL (ZYLOPRIM) 100 MG TABLET    Take 200 mg by mouth every morning.    ASPIRIN 81 MG EC TABLET    Take 81 mg by mouth daily.     ATORVASTATIN (LIPITOR) 20 MG TABLET    Take 20 mg by mouth every morning.    DILTIAZEM (CARDIZEM) 60 MG TABLET    Take 1 tablet (60 mg total) by mouth 3 (three) times daily.   ENOXAPARIN (LOVENOX) 30 MG/0.3ML INJECTION    Inject 0.3 mLs (30 mg total) into the skin daily.   FINASTERIDE (PROSCAR) 5 MG TABLET    Take 5 mg by mouth daily.     GABAPENTIN (NEURONTIN) 600 MG TABLET    Take 600 mg by mouth 4 (four) times daily.   GLUCOSAMINE-CHONDROITIN-VIT C PO    Take 1 capsule by mouth daily.     LANSOPRAZOLE (PREVACID 24HR) 15 MG CAPSULE    Take 15 mg by mouth daily.   LISINOPRIL-HYDROCHLOROTHIAZIDE (PRINZIDE,ZESTORETIC) 20-12.5 MG PER TABLET    Take 1 tablet by mouth daily.    MULTIPLE VITAMINS-MINERALS (CENTRUM SILVER PO)    Take 1 tablet by mouth daily.     OMEGA 3 340 MG CPDR    Take 340 mg by mouth daily.   OXYCODONE (OXY IR/ROXICODONE) 5 MG IMMEDIATE RELEASE TABLET    Take 1-2 tablets (5-10 mg total) by mouth every 4 (four) hours as needed  for breakthrough pain.   TAMSULOSIN HCL (FLOMAX) 0.4 MG CAPS    Take 0.4 mg by mouth every morning.    TRAMADOL (ULTRAM) 50 MG TABLET    Take 1-2 tablets (50-100 mg total) by mouth every 4 (four) hours as needed for moderate pain (mild to moderate pain).    Review of Systems  Constitutional: Negative for fever, chills and appetite change.  Respiratory: Negative for chest tightness, shortness of breath and wheezing.   Cardiovascular: Negative for chest pain and palpitations.  Gastrointestinal: Negative for nausea, vomiting and abdominal pain.  Musculoskeletal: Positive for back pain.       Has some pain in back  left side rib area  Neurological: Positive for light-headedness.    Social History  Substance Use Topics  . Smoking status: Former Smoker -- 3.00 packs/day for 55 years    Types: Cigarettes    Quit date: 12/24/1987  . Smokeless tobacco: Not on file  . Alcohol Use: 0.0 oz/week    0 Standard drinks or equivalent per week     Comment: rare   Objective:   BP 110/48 mmHg  Pulse 69  Temp(Src) 98.7 F (37.1 C) (Oral)  Resp 16  Wt 176 lb (79.833 kg)  SpO2 96%   Fall Risk  10/27/2015  Falls in the past year? No     Physical Exam  General Appearance:    Alert, cooperative, no distress  Eyes:    PERRL, conjunctiva/corneas clear, EOM's intact       Lungs:     Clear to auscultation bilaterally, respirations unlabored  Heart:    Regular rate and rhythm  Neurologic:   Awake, alert, oriented x 3. No apparent focal neurological           defect.   MS:   Well healed surgical scar right knee    Results for orders placed or performed in visit on 10/27/15  POCT glycosylated hemoglobin (Hb A1C)  Result Value Ref Range   Hemoglobin A1C 6.3    Est. average glucose Bld gHb Est-mCnc 134        Assessment & Plan:     1. Diabetes mellitus with nephropathy ([place]) Well controlled.   - POCT glycosylated hemoglobin (Hb A1C)  2. Arthralgia Doing well on current regiment pain medications.  - oxyCODONE-acetaminophen (PERCOCET) 7.5-325 MG tablet; Take 1 tablet by mouth every 8 (eight) hours as needed for severe pain.  3. Osteoarthritis of left knee, unspecified osteoarthritis type 1 month s/p right TKR, doing well.   4. Essential hypertension Occasional orthostatic symptoms. Cut back on lisinopril dose and recheck in 3 months.  - lisinopril-hydrochlorothiazide (PRINZIDE,ZESTORETIC) 10-12.5 MG tablet; Take 1 tablet by mouth daily.  Dispense: 90 tablet; Refill: 3       Lelon Huh, MD  [facility]

## 2015-11-09 ENCOUNTER — Other Ambulatory Visit: Payer: Self-pay | Admitting: Unknown Physician Specialty

## 2015-11-09 DIAGNOSIS — M5412 Radiculopathy, cervical region: Secondary | ICD-10-CM

## 2015-11-15 ENCOUNTER — Ambulatory Visit: Payer: Medicare Other

## 2015-11-28 ENCOUNTER — Other Ambulatory Visit: Payer: Self-pay | Admitting: Family Medicine

## 2015-11-28 DIAGNOSIS — M2550 Pain in unspecified joint: Secondary | ICD-10-CM

## 2015-11-28 MED ORDER — OXYCODONE-ACETAMINOPHEN 7.5-325 MG PO TABS: 1.0000 | ORAL_TABLET | Freq: Three times a day (TID) | ORAL | Status: DC | PRN

## 2015-11-28 NOTE — Telephone Encounter (Signed)
Pt contacted office for refill request on the following medications:  oxyCODONE-acetaminophen (PERCOCET) 7.5-325 MG tablet.  CB#[phone]/MW

## 2015-11-29 ENCOUNTER — Encounter: Payer: Self-pay | Admitting: Family Medicine

## 2015-11-29 ENCOUNTER — Ambulatory Visit (INDEPENDENT_AMBULATORY_CARE_PROVIDER_SITE_OTHER): Payer: Medicare Other | Admitting: Family Medicine

## 2015-11-29 VITALS — BP 108/50 | HR 64 | Temp 97.8°F | Resp 16 | Wt 179.0 lb

## 2015-11-29 DIAGNOSIS — K5903 Drug induced constipation: Secondary | ICD-10-CM | POA: Diagnosis not present

## 2015-11-29 DIAGNOSIS — J329 Chronic sinusitis, unspecified: Secondary | ICD-10-CM | POA: Diagnosis not present

## 2015-11-29 DIAGNOSIS — M2550 Pain in unspecified joint: Secondary | ICD-10-CM

## 2015-11-29 DIAGNOSIS — T402X5A Adverse effect of other opioids, initial encounter: Secondary | ICD-10-CM

## 2015-11-29 DIAGNOSIS — R0982 Postnasal drip: Secondary | ICD-10-CM

## 2015-11-29 MED ORDER — FLUTICASONE PROPIONATE 50 MCG/ACT NA SUSP: 2.0000 | Freq: Every day | NASAL | Status: AC

## 2015-11-29 MED ORDER — OXYCODONE-ACETAMINOPHEN 5-325 MG PO TABS: 1.0000 | ORAL_TABLET | Freq: Three times a day (TID) | ORAL | Status: DC | PRN

## 2015-11-29 NOTE — Patient Instructions (Addendum)
Take a heaping spoonful of Miralax mixed with a full glass of water every night until you have a normal bowel movement, then change to OTC Metamucil every day  Use an OTC Fleet's Enema to stimulate a bowel movement

## 2015-11-29 NOTE — Progress Notes (Signed)
Patient: Bob Christensen Male    DOB: 07/21/1941   75 y.o.   MRN: [id number] Visit Date: 11/29/2015  Today's Provider: Lelon Huh, MD   Chief Complaint  Patient presents with  . Constipation    x 5 days   Subjective:    Constipation This is a new problem. Episode onset: 5 days. The problem is unchanged (has not had a bowel movement in 5 days). There has not been adequate water intake. Associated symptoms include abdominal pain, anorexia, back pain, bloating and flatus. Pertinent negatives include no diarrhea, difficulty urinating, fecal incontinence, fever, melena, nausea, rectal pain, vomiting or weight loss. He has tried laxatives for the symptoms. The treatment provided no relief.  He has a long history of constipation and states Dr. Tiffany Kocher has him on Senakot every day, but has had much more trouble with constipation since having knee surgery in November.  He also complaints of persistent irritation in the back of throat and sometimes having trouble swallowing.      Allergies  Allergen Reactions  . Digoxin Other (See Comments)    "Heart stop.  Shocked my heart"   Previous Medications   ALLOPURINOL (ZYLOPRIM) 100 MG TABLET    Take 200 mg by mouth every morning.    ASPIRIN 81 MG EC TABLET    Take 81 mg by mouth daily.     ATORVASTATIN (LIPITOR) 20 MG TABLET    Take 20 mg by mouth every morning.    DILTIAZEM (CARDIZEM) 60 MG TABLET    Take 1 tablet (60 mg total) by mouth 3 (three) times daily.   ENOXAPARIN (LOVENOX) 30 MG/0.3ML INJECTION    Inject 0.3 mLs (30 mg total) into the skin daily.   FINASTERIDE (PROSCAR) 5 MG TABLET    Take 5 mg by mouth daily.     GABAPENTIN (NEURONTIN) 600 MG TABLET    Take 600 mg by mouth 4 (four) times daily.   GLUCOSAMINE-CHONDROITIN-VIT C PO    Take 1 capsule by mouth daily.     LANSOPRAZOLE (PREVACID 24HR) 15 MG CAPSULE    Take 15 mg by mouth daily.   LISINOPRIL-HYDROCHLOROTHIAZIDE (PRINZIDE,ZESTORETIC) 10-12.5 MG TABLET    Take 1 tablet  by mouth daily.   MULTIPLE VITAMINS-MINERALS (CENTRUM SILVER PO)    Take 1 tablet by mouth daily.     OMEGA 3 340 MG CPDR    Take 340 mg by mouth daily.   OXYCODONE-ACETAMINOPHEN (PERCOCET) 7.5-325 MG TABLET    Take 1 tablet by mouth every 8 (eight) hours as needed for severe pain.   TAMSULOSIN HCL (FLOMAX) 0.4 MG CAPS    Take 0.4 mg by mouth every morning.    TRAMADOL (ULTRAM) 50 MG TABLET    Take 1-2 tablets (50-100 mg total) by mouth every 4 (four) hours as needed for moderate pain (mild to moderate pain).    Review of Systems  Constitutional: Positive for appetite change and fatigue. Negative for fever, chills and weight loss.  Respiratory: Negative for chest tightness, shortness of breath and wheezing.   Cardiovascular: Negative for chest pain and palpitations.  Gastrointestinal: Positive for abdominal pain, constipation, bloating, anorexia and flatus. Negative for nausea, vomiting, diarrhea, melena and rectal pain.  Genitourinary: Negative for difficulty urinating.  Musculoskeletal: Positive for back pain.  Neurological: Positive for light-headedness (off balanace when standing).    Social History  Substance Use Topics  . Smoking status: Former Smoker -- 3.00 packs/day for 55 years  Types: Cigarettes    Quit date: 12/24/1987  . Smokeless tobacco: Not on file  . Alcohol Use: No   Objective:   BP 108/50 mmHg  Pulse 64  Temp(Src) 97.8 F (36.6 C) (Oral)  Resp 16  Wt 179 lb (81.194 kg)  SpO2 96%  Physical Exam  General Appearance:    Alert, cooperative, no distress  HENT:   neck without nodes, sinuses nontender, post nasal drip noted and nasal mucosa congested  Eyes:    PERRL, conjunctiva/corneas clear, EOM's intact       Lungs:     Clear to auscultation bilaterally, respirations unlabored  Heart:    Regular rate and rhythm  Abd:   Mild tenderness right abdomen, mildly bloated, hypoactive bowel sounds. No distinct masses. No rebound or guarding.           Assessment  & Plan:     1. Arthralgia Try cutting back dose of oxycodone due to worsening constipation - oxyCODONE-acetaminophen (ROXICET) 5-325 MG tablet; Take 1 tablet by mouth every 8 (eight) hours as needed for severe pain.  Dispense: 90 tablet; Refill: 0  2. Constipation due to opioid therapy Patient Instructions  Take a heaping spoonful of Miralax mixed with a full glass of water every night until you have a normal bowel movement, then change to OTC Metamucil every day  Use Bob OTC Fleet's Enema to stimulate a bowel movement    3. Post-nasal discharge Having a globus sensation which may be due to PNDr. Try nasal steroid. He will call back for ENT referral if not improving within a week.  - fluticasone (FLONASE) 50 MCG/ACT nasal spray; Place 2 sprays into both nostrils daily.  Dispense: 16 g; Refill: 6        Lelon Huh, MD  [facility]

## 2015-12-04 ENCOUNTER — Encounter: Payer: Self-pay | Admitting: Emergency Medicine

## 2015-12-04 ENCOUNTER — Inpatient Hospital Stay
Admission: EM | Admit: 2015-12-04 | Discharge: 2015-12-08 | DRG: 841 | Disposition: A | Discharge status: Skilled Nursing Facility | Payer: Medicare Other | Attending: Internal Medicine | Admitting: Internal Medicine

## 2015-12-04 ENCOUNTER — Emergency Department: Payer: Medicare Other

## 2015-12-04 DIAGNOSIS — R1084 Generalized abdominal pain: Secondary | ICD-10-CM

## 2015-12-04 DIAGNOSIS — M1990 Unspecified osteoarthritis, unspecified site: Secondary | ICD-10-CM | POA: Diagnosis present

## 2015-12-04 DIAGNOSIS — R7989 Other specified abnormal findings of blood chemistry: Secondary | ICD-10-CM | POA: Diagnosis present

## 2015-12-04 DIAGNOSIS — C8590 Non-Hodgkin lymphoma, unspecified, unspecified site: Principal | ICD-10-CM | POA: Diagnosis present

## 2015-12-04 DIAGNOSIS — I10 Essential (primary) hypertension: Secondary | ICD-10-CM | POA: Diagnosis present

## 2015-12-04 DIAGNOSIS — E860 Dehydration: Secondary | ICD-10-CM | POA: Diagnosis present

## 2015-12-04 DIAGNOSIS — N179 Acute kidney failure, unspecified: Secondary | ICD-10-CM | POA: Diagnosis present

## 2015-12-04 DIAGNOSIS — R1900 Intra-abdominal and pelvic swelling, mass and lump, unspecified site: Secondary | ICD-10-CM | POA: Diagnosis not present

## 2015-12-04 DIAGNOSIS — Z96642 Presence of left artificial hip joint: Secondary | ICD-10-CM | POA: Diagnosis present

## 2015-12-04 DIAGNOSIS — E119 Type 2 diabetes mellitus without complications: Secondary | ICD-10-CM | POA: Diagnosis present

## 2015-12-04 DIAGNOSIS — Z888 Allergy status to other drugs, medicaments and biological substances status: Secondary | ICD-10-CM | POA: Diagnosis not present

## 2015-12-04 DIAGNOSIS — R180 Malignant ascites: Secondary | ICD-10-CM | POA: Diagnosis present

## 2015-12-04 DIAGNOSIS — Z96652 Presence of left artificial knee joint: Secondary | ICD-10-CM | POA: Diagnosis present

## 2015-12-04 DIAGNOSIS — E1121 Type 2 diabetes mellitus with diabetic nephropathy: Secondary | ICD-10-CM | POA: Diagnosis present

## 2015-12-04 DIAGNOSIS — Z85820 Personal history of malignant melanoma of skin: Secondary | ICD-10-CM

## 2015-12-04 DIAGNOSIS — R188 Other ascites: Secondary | ICD-10-CM | POA: Diagnosis not present

## 2015-12-04 DIAGNOSIS — I480 Paroxysmal atrial fibrillation: Secondary | ICD-10-CM | POA: Diagnosis present

## 2015-12-04 DIAGNOSIS — Z86010 Personal history of colonic polyps: Secondary | ICD-10-CM

## 2015-12-04 DIAGNOSIS — R198 Other specified symptoms and signs involving the digestive system and abdomen: Secondary | ICD-10-CM | POA: Insufficient documentation

## 2015-12-04 DIAGNOSIS — N400 Enlarged prostate without lower urinary tract symptoms: Secondary | ICD-10-CM | POA: Diagnosis present

## 2015-12-04 DIAGNOSIS — Z7982 Long term (current) use of aspirin: Secondary | ICD-10-CM | POA: Diagnosis not present

## 2015-12-04 DIAGNOSIS — C7989 Secondary malignant neoplasm of other specified sites: Secondary | ICD-10-CM | POA: Diagnosis present

## 2015-12-04 DIAGNOSIS — G8929 Other chronic pain: Secondary | ICD-10-CM | POA: Diagnosis present

## 2015-12-04 DIAGNOSIS — R531 Weakness: Secondary | ICD-10-CM

## 2015-12-04 DIAGNOSIS — Z79899 Other long term (current) drug therapy: Secondary | ICD-10-CM | POA: Diagnosis not present

## 2015-12-04 DIAGNOSIS — K5730 Diverticulosis of large intestine without perforation or abscess without bleeding: Secondary | ICD-10-CM | POA: Diagnosis present

## 2015-12-04 DIAGNOSIS — Z87891 Personal history of nicotine dependence: Secondary | ICD-10-CM | POA: Diagnosis not present

## 2015-12-04 DIAGNOSIS — E782 Mixed hyperlipidemia: Secondary | ICD-10-CM | POA: Diagnosis present

## 2015-12-04 DIAGNOSIS — K641 Second degree hemorrhoids: Secondary | ICD-10-CM | POA: Diagnosis present

## 2015-12-04 DIAGNOSIS — K921 Melena: Secondary | ICD-10-CM | POA: Diagnosis present

## 2015-12-04 DIAGNOSIS — C189 Malignant neoplasm of colon, unspecified: Secondary | ICD-10-CM | POA: Diagnosis present

## 2015-12-04 DIAGNOSIS — R1904 Left lower quadrant abdominal swelling, mass and lump: Secondary | ICD-10-CM

## 2015-12-04 DIAGNOSIS — D649 Anemia, unspecified: Secondary | ICD-10-CM | POA: Diagnosis present

## 2015-12-04 DIAGNOSIS — R109 Unspecified abdominal pain: Secondary | ICD-10-CM | POA: Diagnosis not present

## 2015-12-04 DIAGNOSIS — R933 Abnormal findings on diagnostic imaging of other parts of digestive tract: Secondary | ICD-10-CM | POA: Diagnosis not present

## 2015-12-04 HISTORY — DX: Other specified diseases of intestine: K63.89

## 2015-12-04 LAB — URINALYSIS COMPLETE WITH MICROSCOPIC (ARMC ONLY)
BACTERIA UA: NONE SEEN
Bilirubin Urine: NEGATIVE
Glucose, UA: NEGATIVE mg/dL
Leukocytes, UA: NEGATIVE
Nitrite: NEGATIVE
PH: 5 (ref 5.0–8.0)
PROTEIN: NEGATIVE mg/dL
Specific Gravity, Urine: 1.015 (ref 1.005–1.030)

## 2015-12-04 LAB — COMPREHENSIVE METABOLIC PANEL
ALT: 18 U/L (ref 17–63)
AST: 27 U/L (ref 15–41)
Albumin: 3.4 g/dL — ABNORMAL LOW (ref 3.5–5.0)
Alkaline Phosphatase: 73 U/L (ref 38–126)
Anion gap: 11 (ref 5–15)
BUN: 72 mg/dL — AB (ref 6–20)
CHLORIDE: 104 mmol/L (ref 101–111)
CO2: 26 mmol/L (ref 22–32)
CREATININE: 2.17 mg/dL — AB (ref 0.61–1.24)
Calcium: 14 mg/dL (ref 8.9–10.3)
GFR calc non Af Amer: 28 mL/min — ABNORMAL LOW (ref >60)
GFR, EST AFRICAN AMERICAN: 33 mL/min — AB (ref >60)
Glucose, Bld: 94 mg/dL (ref 65–99)
POTASSIUM: 5 mmol/L (ref 3.5–5.1)
SODIUM: 141 mmol/L (ref 135–145)
Total Bilirubin: 1 mg/dL (ref 0.3–1.2)
Total Protein: 6.1 g/dL — ABNORMAL LOW (ref 6.5–8.1)

## 2015-12-04 LAB — CBC
HEMATOCRIT: 34.1 % — AB (ref 40.0–52.0)
Hemoglobin: 10.8 g/dL — ABNORMAL LOW (ref 13.0–18.0)
MCH: 28 pg (ref 26.0–34.0)
MCHC: 31.7 g/dL — ABNORMAL LOW (ref 32.0–36.0)
MCV: 88 fL (ref 80.0–100.0)
PLATELETS: 336 10*3/uL (ref 150–440)
RBC: 3.88 MIL/uL — AB (ref 4.40–5.90)
RDW: 16.4 % — ABNORMAL HIGH (ref 11.5–14.5)
WBC: 10.8 10*3/uL — ABNORMAL HIGH (ref 3.8–10.6)

## 2015-12-04 LAB — LIPASE, BLOOD: LIPASE: 23 U/L (ref 11–51)

## 2015-12-04 MED ORDER — OMEGA 3 340 MG PO CPDR: 340.0000 mg | DELAYED_RELEASE_CAPSULE | Freq: Every day | ORAL | Status: DC

## 2015-12-04 MED ORDER — ADULT MULTIVITAMIN W/MINERALS CH
1.0000 | ORAL_TABLET | Freq: Every day | ORAL | Status: DC
  Administered 2015-12-04 – 2015-12-05 (×2): 1 via ORAL
  Filled 2015-12-04 (×2): qty 1

## 2015-12-04 MED ORDER — FINASTERIDE 5 MG PO TABS
5.0000 mg | ORAL_TABLET | Freq: Every day | ORAL | Status: DC
Start: 2015-12-04 — End: 2015-12-08
  Administered 2015-12-05 – 2015-12-08 (×2): 5 mg via ORAL
  Filled 2015-12-04 (×2): qty 1

## 2015-12-04 MED ORDER — ACETAMINOPHEN 325 MG PO TABS
650.0000 mg | ORAL_TABLET | Freq: Four times a day (QID) | ORAL | Status: DC | PRN
  Filled 2015-12-04: qty 2

## 2015-12-04 MED ORDER — SODIUM CHLORIDE 0.9 % IV SOLN
INTRAVENOUS | Status: DC
  Administered 2015-12-04 – 2015-12-08 (×8): via INTRAVENOUS

## 2015-12-04 MED ORDER — OMEGA-3-ACID ETHYL ESTERS 1 G PO CAPS
1.0000 g | ORAL_CAPSULE | Freq: Every day | ORAL | Status: DC
Start: 2015-12-04 — End: 2015-12-08
  Administered 2015-12-04 – 2015-12-08 (×3): 1 g via ORAL
  Filled 2015-12-04 (×3): qty 1

## 2015-12-04 MED ORDER — ALLOPURINOL 100 MG PO TABS
200.0000 mg | ORAL_TABLET | ORAL | Status: DC
  Administered 2015-12-05 – 2015-12-08 (×2): 200 mg via ORAL
  Filled 2015-12-04 (×2): qty 2

## 2015-12-04 MED ORDER — CENTRUM SILVER PO TABS: 1.0000 | ORAL_TABLET | Freq: Every day | ORAL | Status: DC

## 2015-12-04 MED ORDER — MORPHINE SULFATE (PF) 2 MG/ML IV SOLN
1.0000 mg | Freq: Four times a day (QID) | INTRAVENOUS | Status: DC | PRN
  Administered 2015-12-05: 1 mg via INTRAVENOUS
  Filled 2015-12-04: qty 1

## 2015-12-04 MED ORDER — SENNOSIDES-DOCUSATE SODIUM 8.6-50 MG PO TABS: 1.0000 | ORAL_TABLET | Freq: Every evening | ORAL | Status: DC | PRN

## 2015-12-04 MED ORDER — GLUCOSAMINE-CHONDROITIN-VIT C 2000-1200-60 MG/30ML PO LIQD: 1.0000 | Freq: Every day | ORAL | Status: DC

## 2015-12-04 MED ORDER — SODIUM CHLORIDE 0.9 % IV BOLUS (SEPSIS)
1000.0000 mL | Freq: Once | INTRAVENOUS | Status: AC
  Administered 2015-12-04: 1000 mL via INTRAVENOUS

## 2015-12-04 MED ORDER — ONDANSETRON HCL 4 MG/2ML IJ SOLN
4.0000 mg | Freq: Four times a day (QID) | INTRAMUSCULAR | Status: DC | PRN
  Administered 2015-12-05 – 2015-12-07 (×3): 4 mg via INTRAVENOUS
  Filled 2015-12-04 (×3): qty 2

## 2015-12-04 MED ORDER — ENOXAPARIN SODIUM 30 MG/0.3ML ~~LOC~~ SOLN: 30.0000 mg | SUBCUTANEOUS | Status: DC

## 2015-12-04 MED ORDER — PANTOPRAZOLE SODIUM 40 MG PO TBEC
40.0000 mg | DELAYED_RELEASE_TABLET | Freq: Every day | ORAL | Status: DC
  Administered 2015-12-05 – 2015-12-08 (×2): 40 mg via ORAL
  Filled 2015-12-04 (×2): qty 1

## 2015-12-04 MED ORDER — ACETAMINOPHEN 650 MG RE SUPP: 650.0000 mg | Freq: Four times a day (QID) | RECTAL | Status: DC | PRN

## 2015-12-04 MED ORDER — FLUTICASONE PROPIONATE 50 MCG/ACT NA SUSP
2.0000 | Freq: Every day | NASAL | Status: DC
  Administered 2015-12-05 – 2015-12-08 (×2): 2 via NASAL
  Filled 2015-12-04: qty 16

## 2015-12-04 MED ORDER — TAMSULOSIN HCL 0.4 MG PO CAPS
0.4000 mg | ORAL_CAPSULE | Freq: Every morning | ORAL | Status: DC
  Administered 2015-12-05 – 2015-12-08 (×2): 0.4 mg via ORAL
  Filled 2015-12-04 (×2): qty 1

## 2015-12-04 MED ORDER — DOCUSATE SODIUM 100 MG PO CAPS
100.0000 mg | ORAL_CAPSULE | Freq: Two times a day (BID) | ORAL | Status: DC
  Administered 2015-12-04 – 2015-12-08 (×5): 100 mg via ORAL
  Filled 2015-12-04 (×5): qty 1

## 2015-12-04 MED ORDER — TRAMADOL HCL 50 MG PO TABS
50.0000 mg | ORAL_TABLET | ORAL | Status: DC | PRN
  Administered 2015-12-05: 50 mg via ORAL
  Administered 2015-12-05: 100 mg via ORAL
  Administered 2015-12-06: 50 mg via ORAL
  Filled 2015-12-04 (×2): qty 1
  Filled 2015-12-04: qty 2

## 2015-12-04 MED ORDER — IOHEXOL 240 MG/ML SOLN
25.0000 mL | INTRAMUSCULAR | Status: AC
  Administered 2015-12-04 (×2): 25 mL via ORAL

## 2015-12-04 MED ORDER — SODIUM CHLORIDE 0.9 % IV SOLN
Freq: Once | INTRAVENOUS | Status: AC
  Administered 2015-12-04: 13:00:00 via INTRAVENOUS

## 2015-12-04 MED ORDER — ONDANSETRON HCL 4 MG PO TABS: 4.0000 mg | ORAL_TABLET | Freq: Four times a day (QID) | ORAL | Status: DC | PRN

## 2015-12-04 MED ORDER — DILTIAZEM HCL 60 MG PO TABS
60.0000 mg | ORAL_TABLET | Freq: Three times a day (TID) | ORAL | Status: DC
  Administered 2015-12-04 – 2015-12-08 (×7): 60 mg via ORAL
  Filled 2015-12-04 (×8): qty 1

## 2015-12-04 MED ORDER — OXYCODONE-ACETAMINOPHEN 5-325 MG PO TABS
1.0000 | ORAL_TABLET | Freq: Three times a day (TID) | ORAL | Status: DC | PRN
  Administered 2015-12-05 – 2015-12-06 (×4): 1 via ORAL
  Filled 2015-12-04 (×4): qty 1

## 2015-12-04 MED ORDER — ATORVASTATIN CALCIUM 20 MG PO TABS
20.0000 mg | ORAL_TABLET | Freq: Every morning | ORAL | Status: DC
  Administered 2015-12-05 – 2015-12-08 (×2): 20 mg via ORAL
  Filled 2015-12-04 (×2): qty 1

## 2015-12-04 NOTE — ED Provider Notes (Signed)
[facility] Emergency Department Provider Note     Time seen: ----------------------------------------- 11:25 AM on 12/04/2015 -----------------------------------------    I have reviewed the triage vital signs and the nursing notes.   HISTORY  Chief Complaint Otalgia; Dizziness; Weakness; Melena; and Abdominal Pain    HPI Bob Christensen is a 75 y.o. male brought to the ER with bloating and decreased appetite, weakness and dizziness. Patient notes she's had black stools for about a week now, also has noted some inner ear issues as well. Patient states weakness goes back several months, denies fevers, chills, chest pain, shortness of breath or vomiting. Patient has never had this before, he is moving his bowels but was taking MiraLAX for constipation.   Past Medical History  Diagnosis Date  . History of colon polyps 2012    a. Tubular adenoma excised by Tiffany Kocher  . History of kidney stones   . Osteoarthritis   . Brachial neuritis   . GI bleed     a. Following polypectomy  . Biceps tendon rupture 2015  . Malignant melanoma of skin of scalp ([place]) 2011    a. Excised 2001 [facility])  . PAF (paroxysmal atrial fibrillation) ([place])     a. 1997 - no known recurrence since;  b. CHA2DS2VASc = 4.  . Carotid arterial disease ([place])     a. 07/2014 Carotid U/S: bilat 0-39% stenosis.  . Degenerative joint disease     a. 2010 s/p L THA;  b. 2016 - pending L knee arthroplasty.  . H/O cardiac catheterization     a. 1997 - reportedly normal.  . Essential hypertension   . Diet-controlled diabetes mellitus ([place])   . Diabetes mellitus without complication [facility])     Patient Active Problem List   Diagnosis Date Noted  . S/P total knee replacement 09/27/2015  . PAF (paroxysmal atrial fibrillation) ([place])   . Arthritis of knee, degenerative 06/06/2015  . Carpal tunnel syndrome on both sides 05/31/2015  . Pre-ulcerative calluses 05/26/2015  .  Overweight 05/26/2015  . Numbness of hand 05/26/2015  . History of kidney stones 05/26/2015  . GI (gastrointestinal bleed) 05/26/2015  . Back pain with radiation 05/26/2015  . Arthralgia 05/26/2015  . Anemia 05/26/2015  . Fatigue 06/02/2013  . Elevated prostate specific antigen (PSA) 09/11/2012  . Chronic pain disorder 07/27/2012  . Numbness in feet 07/30/2011  . Diabetes mellitus with nephropathy ([place]) 04/06/2010  . Hyperlipidemia, mixed 04/06/2010  . Hypertension, essential, benign 04/06/2010  . Abnormal prostate specific antigen 03/06/2010  . GERD (gastroesophageal reflux disease) 08/03/2009  . Benign prostatic hypertrophy without urinary obstruction 11/12/2007  . Gout 05/01/2006  . History of atrial fibrillation 11/12/1995    Past Surgical History  Procedure Laterality Date  . Total hip arthroplasty Left 2010  . Melanoma on head  2011  . Lumbar laminectomy  2006    Dr Carloyn Manner  . Hand surgery  2012  . Carpal tunnel release  10/23/2011    left hand  . Lateral fusion lumbar spine, thoracotomy    . Melanoma excision Left 11/2011    Shoulder  . Cataract extraction, bilateral Bilateral 2013  . Nerve surgery Left 2011    Elbow  . Ulnar nerve repair  2010    Row  . Total knee arthroplasty Left 09/27/2015    Procedure: TOTAL KNEE ARTHROPLASTY;  Surgeon: Leanor Kail, MD;  Location: [facility] ORS;  Service: Orthopedics;  Laterality: Left;    Allergies Digoxin  Social History Social History  Substance Use Topics  . Smoking status: Former Smoker -- 3.00 packs/day for 55 years    Types: Cigarettes    Quit date: 12/24/1987  . Smokeless tobacco: None  . Alcohol Use: No    Review of Systems Constitutional: Negative for fever. Eyes: Negative for visual changes. ENT: Negative for sore throat. Cardiovascular: Negative for chest pain. Respiratory: Negative for shortness of breath. Gastrointestinal: Positive for abdominal pain and constipation, black stools Genitourinary:  Negative for dysuria. Musculoskeletal: Negative for back pain. Skin: Negative for rash. Neurological: Negative for headaches, positive for weakness 10-point ROS otherwise negative.  ____________________________________________   PHYSICAL EXAM:  VITAL SIGNS: ED Triage Vitals  Enc Vitals Group     BP 12/04/15 0938 131/52 mmHg     Pulse Rate 12/04/15 0938 81     Resp 12/04/15 0938 18     Temp 12/04/15 0938 97.7 F (36.5 C)     Temp Source 12/04/15 0938 Oral     SpO2 12/04/15 0938 97 %     Weight 12/04/15 0944 170 lb (77.111 kg)     Height 12/04/15 0944 5\' 7"  (1.702 m)     Head Cir --      Peak Flow --      Pain Score 12/04/15 0945 10     Pain Loc --      Pain Edu? --      Excl. in [place]? --     Constitutional: Alert and oriented. Well appearing and in no distress. Eyes: Conjunctivae are normal. PERRL. Normal extraocular movements. ENT   Head: Normocephalic and atraumatic.   Nose: No congestion/rhinnorhea.   Mouth/Throat: Mucous membranes are moist.   Neck: No stridor. Cardiovascular: Normal rate, regular rhythm. Normal and symmetric distal pulses are present in all extremities. No murmurs, rubs, or gallops. Respiratory: Normal respiratory effort without tachypnea nor retractions. Breath sounds are clear and equal bilaterally. No wheezes/rales/rhonchi. Gastrointestinal: Distended, Small reducible umbilical hernia, normal bowel sounds Musculoskeletal: Nontender with normal range of motion in all extremities. No joint effusions.  No lower extremity tenderness nor edema. Neurologic:  Normal speech and language. No gross focal neurologic deficits are appreciated. Speech is normal. No gait instability. Skin:  Skin is warm, dry and intact. No rash noted. Psychiatric: Mood and affect are normal. Speech and behavior are normal. Patient exhibits appropriate insight and judgment. ____________________________________________  EKG: Interpreted by  me.  ____________________________________________  ED COURSE:  Pertinent labs & imaging results that were available during my care of the patient were reviewed by me and considered in my medical decision making (see chart for details). Patient is in no acute distress, will check basic labs and CT imaging. He is being given fluids for hypercalcemia ____________________________________________    LABS (pertinent positives/negatives)  Labs Reviewed  COMPREHENSIVE METABOLIC PANEL - Abnormal; Notable for the following:    BUN 72 (*)    Creatinine, Ser 2.17 (*)    Calcium 14.0 (*)    Total Protein 6.1 (*)    Albumin 3.4 (*)    GFR calc non Af Amer 28 (*)    GFR calc Af Amer 33 (*)    All other components within normal limits  CBC - Abnormal; Notable for the following:    WBC 10.8 (*)    RBC 3.88 (*)    Hemoglobin 10.8 (*)    HCT 34.1 (*)    MCHC 31.7 (*)    RDW 16.4 (*)    All other components within normal limits  URINALYSIS COMPLETEWITH  MICROSCOPIC ([facility] ONLY) - Abnormal; Notable for the following:    Color, Urine YELLOW (*)    APPearance CLEAR (*)    Ketones, ur TRACE (*)    Hgb urine dipstick 1+ (*)    Squamous Epithelial / LPF 0-5 (*)    All other components within normal limits  LIPASE, BLOOD    RADIOLOGY  CT abdomen and pelvis with contrast IMPRESSION: Markedly abnormal study with extensive peritoneal/ omental/ mesenteric nodularity. Bulky retroperitoneal adenopathy with a large conglomerate mass in the left lower quadrant intimately associated with the descending colon and proximal sigmoid colon. Differential considerations would include metastatic disease (possible colon primary? ) with peritoneal spread or lymphoma.  Associated moderate ascites.  Right cardiophrenic adenopathy. Nodularity in the right lung base could reflect metastatic disease. Trace left pleural effusion.  Left adrenal nodule concerning for metastatic  disease. ____________________________________________  FINAL ASSESSMENT AND PLAN  Abdominal pain, hypercalcemia, dehydration, abdominal metastasis  Plan: Patient with labs and imaging as dictated above. Patient was given fluids to treat hypercalcemia. 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 Patient will need to be admitted for further oncology workup as well as treatment for hypercalcemia.   Earleen Newport, MD   Earleen Newport, MD 12/04/15 [phone]

## 2015-12-04 NOTE — ED Notes (Signed)
Patient transported to CT 

## 2015-12-04 NOTE — ED Notes (Signed)
Pt here with c/o bloating, decreased appetite, weakness, dizziness, and black stools for about a week now. States he has inner ear issues as well. Speech clear, grips equal and weak bilaterally. Pt had left knee surgery in Nov, feels his weakness goes back to then.

## 2015-12-04 NOTE — H&P (Signed)
[facility] [facility] [place] NAME: Bob Christensen    MR#:  [id number]  DATE OF BIRTH:  November 22, 1940  DATE OF ADMISSION:  12/04/2015  PRIMARY CARE PHYSICIAN: Lelon Huh, MD   REQUESTING/REFERRING PHYSICIAN: Dr. Jimmye Norman  CHIEF COMPLAINT:  Weight loss, poor appetite, dizziness, abdominal bloating for 2 weeks. Blood in stool  HISTORY OF PRESENT ILLNESS:  Bob Christensen  is a 75 y.o. male with a known history of paroxysmal A. fib, carotid artery disease, DJD, history of colon polyps, hypertension comes to the emergency room accompanied by a friend with a double chief complaint. Patient reports having no appetite for last 2 weeks bloated and has noticed some blood in his stools. In the emergency room patient was found to have calcium of 414 and creatinine of 2.17. His baseline creatinine is 1.14.  CT of the abdomen was done which shows extensive peritoneal/omental/mesenteric nodularity. Both daily. Added peroneal adenopathy with a large mass in the left lower quadrant associated with descending colon and proximal sigmoid colon. Patient is being admitted with acute renal failure, severe hypercalcemia and colon mass with possible metastasis. PAST MEDICAL HISTORY:   Past Medical History  Diagnosis Date  . History of colon polyps 2012    a. Tubular adenoma excised by Tiffany Kocher  . History of kidney stones   . Osteoarthritis   . Brachial neuritis   . GI bleed     a. Following polypectomy  . Biceps tendon rupture 2015  . Malignant melanoma of skin of scalp ([place]) 2011    a. Excised 2001 [facility])  . PAF (paroxysmal atrial fibrillation) ([place])     a. 1997 - no known recurrence since;  b. CHA2DS2VASc = 4.  . Carotid arterial disease ([place])     a. 07/2014 Carotid U/S: bilat 0-39% stenosis.  . Degenerative joint disease     a. 2010 s/p L THA;  b. 2016 - pending L knee arthroplasty.  . H/O cardiac catheterization     a. 1997 -  reportedly normal.  . Essential hypertension   . Diet-controlled diabetes mellitus ([place])   . Diabetes mellitus without complication ([place])     PAST SURGICAL HISTOIRY:   Past Surgical History  Procedure Laterality Date  . Total hip arthroplasty Left 2010  . Melanoma on head  2011  . Lumbar laminectomy  2006    Dr Carloyn Manner  . Hand surgery  2012  . Carpal tunnel release  10/23/2011    left hand  . Lateral fusion lumbar spine, thoracotomy    . Melanoma excision Left 11/2011    Shoulder  . Cataract extraction, bilateral Bilateral 2013  . Nerve surgery Left 2011    Elbow  . Ulnar nerve repair  2010    Row  . Total knee arthroplasty Left 09/27/2015    Procedure: TOTAL KNEE ARTHROPLASTY;  Surgeon: Leanor Kail, MD;  Location: [facility] ORS;  Service: Orthopedics;  Laterality: Left;    SOCIAL HISTORY:   Social History  Substance Use Topics  . Smoking status: Former Smoker -- 3.00 packs/day for 55 years    Types: Cigarettes    Quit date: 12/24/1987  . Smokeless tobacco: Not on file  . Alcohol Use: No    FAMILY HISTORY:   Family History  Problem Relation Age of Onset  . Bladder Cancer Father     DRUG ALLERGIES:   Allergies  Allergen Reactions  . Digoxin Other (See Comments)    "Heart stop.  Shocked  my heart"    REVIEW OF SYSTEMS:  Review of Systems  Constitutional: Positive for weight loss and malaise/fatigue. Negative for fever and chills.  HENT: Negative for ear discharge, ear pain and nosebleeds.   Eyes: Negative for blurred vision, pain and discharge.  Respiratory: Negative for sputum production, shortness of breath, wheezing and stridor.   Cardiovascular: Negative for chest pain, palpitations, orthopnea and PND.  Gastrointestinal: Positive for abdominal pain and blood in stool. Negative for nausea, vomiting and diarrhea.  Genitourinary: Negative for urgency and frequency.  Musculoskeletal: Negative for back pain and joint pain.  Neurological: Positive for weakness.  Negative for sensory change, speech change and focal weakness.  Psychiatric/Behavioral: Negative for depression and hallucinations. The patient is not nervous/anxious.   All other systems reviewed and are negative.    MEDICATIONS AT HOME:   Prior to Admission medications   Medication Sig Start Date End Date Taking? Authorizing Provider  allopurinol (ZYLOPRIM) 100 MG tablet Take 200 mg by mouth every morning.    Yes Historical Provider, MD  aspirin 81 MG EC tablet Take 81 mg by mouth daily.     Yes Historical Provider, MD  atorvastatin (LIPITOR) 20 MG tablet Take 20 mg by mouth every morning.  02/16/15  Yes Historical Provider, MD  diltiazem (CARDIZEM) 60 MG tablet Take 1 tablet (60 mg total) by mouth 3 (three) times daily. 05/21/15  Yes Birdie Sons, MD  finasteride (PROSCAR) 5 MG tablet Take 5 mg by mouth daily.     Yes Historical Provider, MD  fluticasone (FLONASE) 50 MCG/ACT nasal spray Place 2 sprays into both nostrils daily. 11/29/15  Yes Birdie Sons, MD  gabapentin (NEURONTIN) 600 MG tablet Take 600 mg by mouth 4 (four) times daily. 02/16/15  Yes Historical Provider, MD  GLUCOSAMINE-CHONDROITIN-VIT C PO Take 1 capsule by mouth daily.     Yes Historical Provider, MD  lansoprazole (PREVACID 24HR) 15 MG capsule Take 15 mg by mouth daily.   Yes Historical Provider, MD  lisinopril-hydrochlorothiazide (PRINZIDE,ZESTORETIC) 10-12.5 MG tablet Take 1 tablet by mouth daily. 10/27/15  Yes Birdie Sons, MD  Multiple Vitamins-Minerals (CENTRUM SILVER PO) Take 1 tablet by mouth daily.     Yes Historical Provider, MD  Omega 3 340 MG CPDR Take 340 mg by mouth daily.   Yes Historical Provider, MD  oxyCODONE-acetaminophen (ROXICET) 5-325 MG tablet Take 1 tablet by mouth every 8 (eight) hours as needed for severe pain. 11/29/15  Yes Birdie Sons, MD  Tamsulosin HCl (FLOMAX) 0.4 MG CAPS Take 0.4 mg by mouth every morning.    Yes Historical Provider, MD  enoxaparin (LOVENOX) 30 MG/0.3ML injection  Inject 0.3 mLs (30 mg total) into the skin daily. Patient not taking: Reported on 12/04/2015 09/29/15   Reche Dixon, PA-C  traMADol (ULTRAM) 50 MG tablet Take 1-2 tablets (50-100 mg total) by mouth every 4 (four) hours as needed for moderate pain (mild to moderate pain). 09/29/15   Reche Dixon, PA-C      VITAL SIGNS:  Blood pressure 145/70, pulse 66, temperature 97.7 F (36.5 C), temperature source Oral, resp. rate 14, height 5\' 7"  (1.702 m), weight 77.111 kg (170 lb), SpO2 94 %.  PHYSICAL EXAMINATION:  GENERAL:  75 y.o.-year-old patient lying in the bed with no acute distress.  EYES: Pupils equal, round, reactive to light and accommodation. No scleral icterus. Extraocular muscles intact.  HEENT: Head atraumatic, normocephalic. Oropharynx and nasopharynx clear.  NECK:  Supple, no jugular venous distention. No thyroid enlargement,  no tenderness.  LUNGS: Normal breath sounds bilaterally, no wheezing, rales,rhonchi or crepitation. No use of accessory muscles of respiration.  CARDIOVASCULAR: S1, S2 normal. No murmurs, rubs, or gallops.  ABDOMEN: Soft, nontender, nondistended. Bowel sounds present. No organomegaly or mass.  EXTREMITIES: No pedal edema, cyanosis, or clubbing.  NEUROLOGIC: Cranial nerves II through XII are intact. Muscle strength 5/5 in all extremities. Sensation intact. Gait not checked.  PSYCHIATRIC: The patient is alert and oriented x 3.  SKIN: No obvious rash, lesion, or ulcer.   LABORATORY PANEL:   CBC  Recent Labs Lab 12/04/15 0946  WBC 10.8*  HGB 10.8*  HCT 34.1*  PLT 336   ------------------------------------------------------------------------------------------------------------------  Chemistries   Recent Labs Lab 12/04/15 0946  NA 141  K 5.0  CL 104  CO2 26  GLUCOSE 94  BUN 72*  CREATININE 2.17*  CALCIUM 14.0*  AST 27  ALT 18  ALKPHOS 73  BILITOT 1.0    ------------------------------------------------------------------------------------------------------------------  Cardiac Enzymes No results for input(s): TROPONINI in the last 168 hours. ------------------------------------------------------------------------------------------------------------------  RADIOLOGY:  Ct Abdomen Pelvis Wo Contrast  12/04/2015  CLINICAL DATA:  Bloating, decreased appetite, weakness. Black stools for 1 week. Diffuse abdominal pain. EXAM: CT ABDOMEN AND PELVIS WITHOUT CONTRAST TECHNIQUE: Multidetector CT imaging of the abdomen and pelvis was performed following the standard protocol without IV contrast. COMPARISON:  12/09/2010 FINDINGS: Adenopathy noted in the right cardiophrenic angle with lymph node measuring 2.3 cm on image 11. Small nodules noted along the right hemidiaphragm, the largest on image 10 measures 11 mm. Small subpleural nodule in referral nodule adjacent to the diaphragm on the same image. Trace left pleural effusion. Probable scarring in the lingula. Heart is borderline in size. There is diffuse omental and peritoneal nodularity. Diffuse mesenteric nodularity. Massive retroperitoneal adenopathy. Conglomerate nodal mass in the right periaortic space on image 42 measures 6.1 x 4.4 cm. There is a large conglomerate mass in the left abdomen and extending into the left pelvis. This measures 11.3 x 4.7 cm. This is intimately associated with the distal descending colon and proximal sigmoid colon. There is ascites noted around the liver and spleen and continued in the paracolic gutters into the pelvis. There is sigmoid diverticulosis. Stomach and small bowel grossly unremarkable. No definite focal abnormality in the liver, spleen, pancreas, right adrenal. Low-density has small low-density areas in the lower poles of the kidneys bilaterally most likely reflects cysts but cannot be fully characterized without intravenous contrast. There is a left adrenal nodule  measuring 2.8 cm. Urinary bladder is grossly unremarkable. Prostate is mildly enlarged. Prior left hip replacement. No acute bony abnormality or focal lesion. Degenerative changes in the lumbar spine. IMPRESSION: Markedly abnormal study with extensive peritoneal/ omental/ mesenteric nodularity. Bulky retroperitoneal adenopathy with a large conglomerate mass in the left lower quadrant intimately associated with the descending colon and proximal sigmoid colon. Differential considerations would include metastatic disease (possible colon primary? ) with peritoneal spread or lymphoma. Associated moderate ascites. Right cardiophrenic adenopathy. Nodularity in the right lung base could reflect metastatic disease. Trace left pleural effusion. Left adrenal nodule concerning for metastatic disease. Electronically Signed   By: Rolm Baptise M.D.   On: 12/04/2015 14:01    IMPRESSION AND PLAN:   Bob Christensen  is a 75 y.o. male with a known history of paroxysmal A. fib, carotid artery disease, DJD, history of colon polyps, hypertension comes to the emergency room accompanied by a friend with a double chief complaint. Patient reports having no appetite for last  2 weeks bloated and has noticed some blood in his stools. In the emergency room patient was found to have calcium of 414 and creatinine of 2.17. His baseline creatinine is 1.14.  1. Severe hypercalcemia with acute renal failure -Patient presented with Nausea, vomiting, poor appetite, weight loss -Admit to medical floor -Aggressive IV fluids -Monitor I's and O's avoid nephrotoxins -Patient's baseline creatinine is 1.14 -He came in with creatinine of 2.17 appears clinically dry  2. Left descending colon and sigmoid mass with possible metastatic lesions to peritoneal/omental nodes -New finding on CT scan abdomen -Oncology consultation case discussed with Dr. Grayland Ormond -GI consultation for sigmoidoscopy/colonoscopy for tissue biopsy  3. Hypertension   -Continue diltiazem for now. Hold off on Prinivil and hydrochlorothiazide given elevated creatinine  4. History of paroxysmal A. Fib -By mouth diltiazem  5. DVT prophylaxis subcutaneous Lovenox  Was discussed with patient and patient's friend. Case was discussed with oncology Dr. Grayland Ormond   All the records are reviewed and case discussed with ED provider. Management plans discussed with the patient, family and they are in agreement.  CODE STATUS: full  TOTAL TIME TAKING CARE OF THIS PATIENT: 50 minutes.    Bob Christensen M.D on 12/04/2015 at 2:32 PM  Between 7am to 6pm - Pager - [phone]  After 6pm go to [email or web address] - password [email or web address] [facility]  Office  [phone]  CC: Primary care physician; Lelon Huh, MD

## 2015-12-04 NOTE — ED Notes (Signed)
Vicky in lab reported critical Calcium value of 13.7. This value reported to Dr. Joni Fears. Orders received for fluids.

## 2015-12-04 NOTE — ED Notes (Signed)
Critical value also reported to charge RN

## 2015-12-04 NOTE — Progress Notes (Signed)
°   12/04/15 1900  Clinical Encounter Type  Visited With Patient and family together;Health care provider  Visit Type Initial  Referral From Nurse  Consult/Referral To Chaplain  Spiritual Encounters  Spiritual Needs Other (Comment)  Stress Factors  Patient Stress Factors Health changes  Family Stress Factors Health changes  Consult for AD documents.  AD education to patient and family, documents given.   [place] Ext [phone]

## 2015-12-05 DIAGNOSIS — K921 Melena: Secondary | ICD-10-CM

## 2015-12-05 DIAGNOSIS — M1990 Unspecified osteoarthritis, unspecified site: Secondary | ICD-10-CM

## 2015-12-05 DIAGNOSIS — R5383 Other fatigue: Secondary | ICD-10-CM

## 2015-12-05 DIAGNOSIS — R634 Abnormal weight loss: Secondary | ICD-10-CM

## 2015-12-05 DIAGNOSIS — M5412 Radiculopathy, cervical region: Secondary | ICD-10-CM

## 2015-12-05 DIAGNOSIS — I480 Paroxysmal atrial fibrillation: Secondary | ICD-10-CM

## 2015-12-05 DIAGNOSIS — R531 Weakness: Secondary | ICD-10-CM

## 2015-12-05 DIAGNOSIS — Z87891 Personal history of nicotine dependence: Secondary | ICD-10-CM

## 2015-12-05 DIAGNOSIS — Z85828 Personal history of other malignant neoplasm of skin: Secondary | ICD-10-CM

## 2015-12-05 DIAGNOSIS — R1900 Intra-abdominal and pelvic swelling, mass and lump, unspecified site: Secondary | ICD-10-CM

## 2015-12-05 DIAGNOSIS — Z87442 Personal history of urinary calculi: Secondary | ICD-10-CM

## 2015-12-05 DIAGNOSIS — I10 Essential (primary) hypertension: Secondary | ICD-10-CM

## 2015-12-05 DIAGNOSIS — R42 Dizziness and giddiness: Secondary | ICD-10-CM

## 2015-12-05 DIAGNOSIS — E119 Type 2 diabetes mellitus without complications: Secondary | ICD-10-CM

## 2015-12-05 DIAGNOSIS — R109 Unspecified abdominal pain: Secondary | ICD-10-CM

## 2015-12-05 DIAGNOSIS — I2510 Atherosclerotic heart disease of native coronary artery without angina pectoris: Secondary | ICD-10-CM

## 2015-12-05 DIAGNOSIS — Z86010 Personal history of colonic polyps: Secondary | ICD-10-CM

## 2015-12-05 DIAGNOSIS — H9209 Otalgia, unspecified ear: Secondary | ICD-10-CM

## 2015-12-05 LAB — BASIC METABOLIC PANEL
Anion gap: 6 (ref 5–15)
BUN: 62 mg/dL — ABNORMAL HIGH (ref 6–20)
CALCIUM: 12.4 mg/dL — AB (ref 8.9–10.3)
CO2: 26 mmol/L (ref 22–32)
CREATININE: 1.74 mg/dL — AB (ref 0.61–1.24)
Chloride: 110 mmol/L (ref 101–111)
GFR calc non Af Amer: 37 mL/min — ABNORMAL LOW (ref >60)
GFR, EST AFRICAN AMERICAN: 43 mL/min — AB (ref >60)
Glucose, Bld: 87 mg/dL (ref 65–99)
Potassium: 4.5 mmol/L (ref 3.5–5.1)
SODIUM: 142 mmol/L (ref 135–145)

## 2015-12-05 LAB — CREATININE CLEARANCE, URINE, 24 HOUR
COLLECTION INTERVAL-CRCL: 24 h
Creatinine Clearance: 1 mL/min — ABNORMAL LOW (ref 75–125)
Creatinine, 24H Ur: 27 mg/d — ABNORMAL LOW (ref 800–2000)
Creatinine, Urine: 113 mg/dL
URINE TOTAL VOLUME-CRCL: 24 mL

## 2015-12-05 LAB — CEA: CEA: 0.4 ng/mL (ref 0.0–4.7)

## 2015-12-05 MED ORDER — PEG 3350-KCL-NA BICARB-NACL 420 G PO SOLR
2000.0000 mL | Freq: Once | ORAL | Status: AC
  Administered 2015-12-05: 2000 mL via ORAL
  Filled 2015-12-05: qty 4000

## 2015-12-05 MED ORDER — MORPHINE SULFATE (PF) 2 MG/ML IV SOLN
1.0000 mg | INTRAVENOUS | Status: DC | PRN
  Administered 2015-12-05 – 2015-12-06 (×6): 1 mg via INTRAVENOUS
  Filled 2015-12-05 (×6): qty 1

## 2015-12-05 MED ORDER — ENOXAPARIN SODIUM 40 MG/0.4ML ~~LOC~~ SOLN
40.0000 mg | SUBCUTANEOUS | Status: DC
  Administered 2015-12-06 – 2015-12-07 (×2): 40 mg via SUBCUTANEOUS
  Filled 2015-12-05 (×2): qty 0.4

## 2015-12-05 NOTE — Evaluation (Signed)
Physical Therapy Evaluation Patient Details Name: Bob Christensen MRN: [id number] DOB: June 15, 1941 Today's Date: 12/05/2015   History of Present Illness  Pt admitted for acute renal failure. Pt with recent L TKR in Nov. 2016. Pt now with positive mass in L lower quadrant and colon with possible mets. Pt with complaints of weight loss, poor appetite, dizziness and bloating x 2 weeks. Pt with history of afib, CAD, HTN, and DJD. Pt confused at this time, only oriented to self.   Clinical Impression  Pt is a pleasant 75 year old male who was admitted for ARF. Pt performs bed mobility with mod indep, transfers with cga, and ambulation with min assist. Ambulation performed without AD, however unsteadiness noted. RW given for further ambulation with improved balance noted. Pt unsafe with ambulation with slight L knee buckling and fatigues with increased distance.  Pt demonstrates deficits with strength/balance/cognition/endurance. Would benefit from skilled PT to address above deficits and promote optimal return to PLOF. Pt is not at baseline level at this time; recommend transition to STR upon discharge from acute hospitalization.       Follow Up Recommendations SNF    Equipment Recommendations       Recommendations for Other Services       Precautions / Restrictions Precautions Precautions: Fall Restrictions Weight Bearing Restrictions: No      Mobility  Bed Mobility Overal bed mobility: Modified Independent             General bed mobility comments: Pt slightly impulsive and tries to transfer to EOB prior to therapist instruction. Safe technique performed with no assistance required. Once seated at EOB, pt able to sit with independence  Transfers Overall transfer level: Needs assistance Equipment used: None Transfers: Sit to/from Stand Sit to Stand: Min guard         General transfer comment: sit<>Stand with no AD, however grabbing for therapist arm for assistance. Once  standing, pt with slightly forward flexed posture.   Ambulation/Gait Ambulation/Gait assistance: Min assist Ambulation Distance (Feet): 5 Feet Assistive device: None Gait Pattern/deviations: Step-to pattern;Shuffle     General Gait Details: ambulated using no AD, however reaching out for IV pole. Forward flexed posture noted, however unsteady gait noted with shuffling gait pattern. Futher ambulation performed with AD.  Stairs            Wheelchair Mobility    Modified Rankin (Stroke Patients Only)       Balance Overall balance assessment: Needs assistance Sitting-balance support: Feet supported Sitting balance-Leahy Scale: Normal     Standing balance support: No upper extremity supported Standing balance-Leahy Scale: Poor                               Pertinent Vitals/Pain Pain Assessment: No/denies pain    Home Living Family/patient expects to be discharged to:: Private residence Living Arrangements: Alone Available Help at Discharge: Family Type of Home: House Home Access: Stairs to enter Entrance Stairs-Rails: None Entrance Stairs-Number of Steps: 1 Home Layout: Able to live on main level with bedroom/bathroom Home Equipment: Cane - single point;Walker - 2 wheels      Prior Function Level of Independence: Independent with assistive device(s)         Comments: was using rw     Hand Dominance        Extremity/Trunk Assessment   Upper Extremity Assessment: Overall WFL for tasks assessed  Lower Extremity Assessment: Generalized weakness (B LE grossly 3+/5)         Communication   Communication: No difficulties  Cognition Arousal/Alertness: Awake/alert Behavior During Therapy: WFL for tasks assessed/performed Overall Cognitive Status: Impaired/Different from baseline                      General Comments      Exercises Other Exercises Other Exercises: Pt ambulated using rw x 80' and min assist. Pt  with L LE buckling and complaints of dizziness during gait training. Pt encouraged to turn back towards room and needed increased assist with increased distance. Pt impulsive and not aware of safety restrictions. Narrow BOS noted with decreased step length. Step to gait pattern performed      Assessment/Plan    PT Assessment Patient needs continued PT services  PT Diagnosis Difficulty walking;Abnormality of gait;Generalized weakness   PT Problem List Decreased strength;Decreased balance;Decreased activity tolerance;Decreased mobility;Decreased knowledge of use of DME;Decreased safety awareness  PT Treatment Interventions Gait training;Therapeutic activities;Therapeutic exercise;DME instruction   PT Goals (Current goals can be found in the Care Plan section) Acute Rehab PT Goals Patient Stated Goal: to go home PT Goal Formulation: With patient Time For Goal Achievement: 12/19/15 Potential to Achieve Goals: Good    Frequency Min 2X/week   Barriers to discharge Decreased caregiver support      Co-evaluation               End of Session Equipment Utilized During Treatment: Gait belt Activity Tolerance: Patient limited by fatigue Patient left: in chair;with chair alarm set Nurse Communication: Mobility status         Time: 0920-0959 PT Time Calculation (min) (ACUTE ONLY): 39 min   Charges:   PT Evaluation $PT Eval Moderate Complexity: 1 Procedure PT Treatments $Gait Training: 8-22 mins   PT G Codes:        Jamyla Ard 01-Jan-2016, 12:11 PM  Greggory Stallion, PT, DPT [phone]

## 2015-12-05 NOTE — Care Management Note (Signed)
Case Management Note  Patient Details  Name: Bob Christensen MRN: [id number] Date of Birth: [birth year]  Subjective/Objective:                  Met with patient to discuss discharge planning. Patient is someone distraught about a potential terminal diagnosis. He said he was recently at the hospital "to have his knee fixed". "I told them my stomach was swelled and they told me it was a hernia". He said "the ED physician said it could be very serious". He "lives alone and was doing well until this happened". He has a rolling walker and used [facility] for PT.   Action/Plan: No current RNCM needs but will continue to follow. Emotional support offered and accepted by patient.    Expected Discharge Date:                  Expected Discharge Plan:     In-House Referral:     Discharge planning Services  CM Consult  Post Acute Care Choice:  Home Health Choice offered to:  Patient  DME Arranged:    DME Agency:     HH Arranged:    [place] Agency:     Status of Service:  In process, will continue to follow  Medicare Important Message Given:    Date Medicare IM Given:    Medicare IM give by:    Date Additional Medicare IM Given:    Additional Medicare Important Message give by:     If discussed at [place] of Stay Meetings, dates discussed:    Additional Comments:  Marshell Garfinkel, RN 12/05/2015, 8:30 AM

## 2015-12-05 NOTE — Consult Note (Signed)
[facility]  [address], [place], [place] [address] Phone: [phone] Fax : [phone]  Consultation  Referring Provider:     No ref. provider found Primary Care Physician:  Lelon Huh, MD Primary Gastroenterologist:  Dr. Vira Agar         Reason for Consultation:     Abnormal CT scan  Date of Admission:  12/04/2015 Date of Consultation:  12/05/2015         HPI:   Bob Christensen is a 75 y.o. male who presents with a report of decreased appetite last 2 weeks with abdominal pain and weight loss dizziness and bloating. The patient also was reported to have blood in his stools. The patient is not a good historian and reports that he thinks he had a colonoscopy 3 months ago although from the records it looks to be December 2015. The patient was admitted with hypercalcemia and elevated creatinine. The patient had a CT scan that showed extensive peritoneal omental and mesenteric nodularity with a large mass in the left lower quadrant associated with the descending colon and proximal sigmoid colon. GI is now being called for possible colonoscopy to evaluate this area.  Past Medical History  Diagnosis Date  . History of colon polyps 2012    a. Tubular adenoma excised by Tiffany Kocher  . History of kidney stones   . Osteoarthritis   . Brachial neuritis   . GI bleed     a. Following polypectomy  . Biceps tendon rupture 2015  . Malignant melanoma of skin of scalp ([place]) 2011    a. Excised 2001 [facility] [place] Thereasa Solo)  . PAF (paroxysmal atrial fibrillation) ([place])     a. 1997 - no known recurrence since;  b. CHA2DS2VASc = 4.  . Carotid arterial disease ([place])     a. 07/2014 Carotid U/S: bilat 0-39% stenosis.  . Degenerative joint disease     a. 2010 s/p L THA;  b. 2016 - pending L knee arthroplasty.  . H/O cardiac catheterization     a. 1997 - reportedly normal.  . Essential hypertension   . Diet-controlled diabetes mellitus ([place])   . Diabetes mellitus without  complication ([place])   . Colonic mass     Past Surgical History  Procedure Laterality Date  . Total hip arthroplasty Left 2010  . Melanoma on head  2011  . Lumbar laminectomy  2006    Dr Carloyn Manner  . Hand surgery  2012  . Carpal tunnel release  10/23/2011    left hand  . Lateral fusion lumbar spine, thoracotomy    . Melanoma excision Left 11/2011    Shoulder  . Cataract extraction, bilateral Bilateral 2013  . Nerve surgery Left 2011    Elbow  . Ulnar nerve repair  2010    Row  . Total knee arthroplasty Left 09/27/2015    Procedure: TOTAL KNEE ARTHROPLASTY;  Surgeon: Leanor Kail, MD;  Location: [facility] ORS;  Service: Orthopedics;  Laterality: Left;  . Joint replacement Left     knee    Prior to Admission medications   Medication Sig Start Date End Date Taking? Authorizing Provider  allopurinol (ZYLOPRIM) 100 MG tablet Take 200 mg by mouth every morning.    Yes Historical Provider, MD  aspirin 81 MG EC tablet Take 81 mg by mouth daily.     Yes Historical Provider, MD  atorvastatin (LIPITOR) 20 MG tablet Take 20 mg by mouth every morning.  02/16/15  Yes Historical Provider, MD  diltiazem (CARDIZEM)  60 MG tablet Take 1 tablet (60 mg total) by mouth 3 (three) times daily. 05/21/15  Yes Birdie Sons, MD  finasteride (PROSCAR) 5 MG tablet Take 5 mg by mouth daily.     Yes Historical Provider, MD  fluticasone (FLONASE) 50 MCG/ACT nasal spray Place 2 sprays into both nostrils daily. 11/29/15  Yes Birdie Sons, MD  gabapentin (NEURONTIN) 600 MG tablet Take 600 mg by mouth 4 (four) times daily. 02/16/15  Yes Historical Provider, MD  GLUCOSAMINE-CHONDROITIN-VIT C PO Take 1 capsule by mouth daily.     Yes Historical Provider, MD  lansoprazole (PREVACID 24HR) 15 MG capsule Take 15 mg by mouth daily.   Yes Historical Provider, MD  lisinopril-hydrochlorothiazide (PRINZIDE,ZESTORETIC) 10-12.5 MG tablet Take 1 tablet by mouth daily. 10/27/15  Yes Birdie Sons, MD  Multiple Vitamins-Minerals (CENTRUM  SILVER PO) Take 1 tablet by mouth daily.     Yes Historical Provider, MD  Omega 3 340 MG CPDR Take 340 mg by mouth daily.   Yes Historical Provider, MD  oxyCODONE-acetaminophen (ROXICET) 5-325 MG tablet Take 1 tablet by mouth every 8 (eight) hours as needed for severe pain. 11/29/15  Yes Birdie Sons, MD  Tamsulosin HCl (FLOMAX) 0.4 MG CAPS Take 0.4 mg by mouth every morning.    Yes Historical Provider, MD  enoxaparin (LOVENOX) 30 MG/0.3ML injection Inject 0.3 mLs (30 mg total) into the skin daily. Patient not taking: Reported on 12/04/2015 09/29/15   Reche Dixon, PA-C  traMADol (ULTRAM) 50 MG tablet Take 1-2 tablets (50-100 mg total) by mouth every 4 (four) hours as needed for moderate pain (mild to moderate pain). 09/29/15   Reche Dixon, PA-C    Family History  Problem Relation Age of Onset  . Bladder Cancer Father      Social History  Substance Use Topics  . Smoking status: Former Smoker -- 3.00 packs/day for 55 years    Types: Cigarettes    Quit date: 12/24/1987  . Smokeless tobacco: None  . Alcohol Use: No    Allergies as of 12/04/2015 - Review Complete 12/04/2015  Allergen Reaction Noted  . Digoxin Other (See Comments)     Review of Systems:    All systems reviewed and negative except where noted in HPI.   Physical Exam:  Vital signs in last 24 hours: Temp:  [97.5 F (36.4 C)-98.5 F (36.9 C)] 97.5 F (36.4 C) (01/24 1010) Pulse Rate:  [63-73] 66 (01/24 1010) Resp:  [13-18] 17 (01/24 1010) BP: (119-148)/(48-75) 119/48 mmHg (01/24 1010) SpO2:  [92 %-97 %] 93 % (01/24 1010) Last BM Date: 12/04/15 General:   Pleasant, cooperative in NAD Head:  Normocephalic and atraumatic. Eyes:   No icterus.   Conjunctiva pink. PERRLA. Ears:  Normal auditory acuity. Neck:  Supple; no masses or thyroidomegaly Lungs: Respirations even and unlabored. Lungs clear to auscultation bilaterally.   No wheezes, crackles, or rhonchi.  Heart:  Regular rate and rhythm;  Without murmur, clicks,  rubs or gallops Abdomen:  Soft, with diffuse distention and tenderness.. Normal bowel sounds. No appreciable masses or hepatomegaly.  No rebound or guarding.  Rectal:  Not performed. Msk:  Symmetrical without gross deformities.    Extremities:  Without edema, cyanosis or clubbing. Neurologic:  Alert and oriented x3;  grossly normal neurologically. Skin:  Intact without significant lesions or rashes. Cervical Nodes:  No significant cervical adenopathy. Psych:  Alert and cooperative. Normal affect.  LAB RESULTS:  Recent Labs  12/04/15 0946  WBC 10.8*  HGB 10.8*  HCT 34.1*  PLT 336   BMET  Recent Labs  12/04/15 0946 12/05/15 0543  NA 141 142  K 5.0 4.5  CL 104 110  CO2 26 26  GLUCOSE 94 87  BUN 72* 62*  CREATININE 2.17* 1.74*  CALCIUM 14.0* 12.4*   LFT  Recent Labs  12/04/15 0946  PROT 6.1*  ALBUMIN 3.4*  AST 27  ALT 18  ALKPHOS 73  BILITOT 1.0   PT/INR No results for input(s): LABPROT, INR in the last 72 hours.  STUDIES: See CT scan    Impression / Plan:   Bob Christensen is a 75 y.o. y/o male with abdominal distention and abnormal CT scan showing mesenteric omental and peritoneal adenopathy with a mass in the sigmoid/descending area. The patient had a colonoscopy by Dr. Vira Agar in December 2015. The patient will be set up for another colonoscopy for tomorrow to sample the area of the colon mass. The patient has been explained the plan and agrees with it. I have discussed risks & benefits which include, but are not limited to, bleeding, infection, perforation & drug reaction.  The patient agrees with this plan & written consent will be obtained.     Thank you for involving me in the care of this patient.      LOS: 1 day   Ollen Bowl, MD  12/05/2015, 1:23 PM   Note: This dictation was prepared with Dragon dictation along with smaller phrase technology. Any transcriptional errors that result from this process are unintentional.

## 2015-12-05 NOTE — Progress Notes (Signed)
Patient was ordered enoxaparin 30 mg subcutaneously q 24 hours for DVT prophylaxis. Patient's renal function has improved since admission with a CrCl of ~ 35 mL/min. Dose has been changed to enoxaparin 40 mg daily per anticoagulation protocol.  Lenis Noon, Sherian Rein D Clinical Pharmacist 9:01 AM

## 2015-12-05 NOTE — NC FL2 (Signed)
[place] LEVEL OF CARE SCREENING TOOL     IDENTIFICATION  Patient Name: Bob Christensen Birthdate: June 14, 1941 Sex: male Admission Date (Current Location): 12/04/2015  [place] and [place] Number:  Engineering geologist and Address:  [facility], [address], [place], [place] [address]      Provider Number: [id number]  Attending Physician Name and Address:  Fritzi Mandes, MD  Relative Name and Phone Number:       Current Level of Care: Hospital Recommended Level of Care: [place] Prior Approval Number:    Date Approved/Denied:   PASRR Number:  ([id number])  Discharge Plan: SNF    Current Diagnoses: Patient Active Problem List   Diagnosis Date Noted  . Hypercalcemia 12/04/2015  . S/P total knee replacement 09/27/2015  . PAF (paroxysmal atrial fibrillation) ([place])   . Arthritis of knee, degenerative 06/06/2015  . Carpal tunnel syndrome on both sides 05/31/2015  . Pre-ulcerative calluses 05/26/2015  . Overweight 05/26/2015  . Numbness of hand 05/26/2015  . History of kidney stones 05/26/2015  . GI (gastrointestinal bleed) 05/26/2015  . Back pain with radiation 05/26/2015  . Arthralgia 05/26/2015  . Anemia 05/26/2015  . Fatigue 06/02/2013  . Elevated prostate specific antigen (PSA) 09/11/2012  . Chronic pain disorder 07/27/2012  . Numbness in feet 07/30/2011  . Diabetes mellitus with nephropathy ([place]) 04/06/2010  . Hyperlipidemia, mixed 04/06/2010  . Hypertension, essential, benign 04/06/2010  . Abnormal prostate specific antigen 03/06/2010  . GERD (gastroesophageal reflux disease) 08/03/2009  . Benign prostatic hypertrophy without urinary obstruction 11/12/2007  . Gout 05/01/2006  . History of atrial fibrillation 11/12/1995    Orientation RESPIRATION BLADDER Height & Weight    Self, Time, Situation, Place  Normal Incontinent 5\' 7"  (170.2 cm) 170 lbs.  BEHAVIORAL SYMPTOMS/MOOD NEUROLOGICAL BOWEL  NUTRITION STATUS   (none )  (none ) Continent Diet (Diet: Soft )  AMBULATORY STATUS COMMUNICATION OF NEEDS Skin   Extensive Assist Verbally Normal                       Personal Care Assistance Level of Assistance  Bathing, Feeding, Dressing Bathing Assistance: Limited assistance Feeding assistance: Independent Dressing Assistance: Limited assistance     Functional Limitations Info  Sight, Hearing, Speech Sight Info: Adequate Hearing Info: Adequate Speech Info: Adequate    SPECIAL CARE FACTORS FREQUENCY  PT (By licensed PT), OT (By licensed OT)     PT Frequency:  (5) OT Frequency:  (5)            Contractures      Additional Factors Info  Code Status, Allergies Code Status Info:  (Full Code. ) Allergies Info:  (Digoxin)           Current Medications (12/05/2015):  This is the current hospital active medication list Current Facility-Administered Medications  Medication Dose Route Frequency Provider Last Rate Last Dose  . 0.9 %  sodium chloride infusion   Intravenous Continuous Bettey Costa, MD 100 mL/hr at 12/05/15 0831    . acetaminophen (TYLENOL) tablet 650 mg  650 mg Oral Q6H PRN Fritzi Mandes, MD       Or  . acetaminophen (TYLENOL) suppository 650 mg  650 mg Rectal Q6H PRN Fritzi Mandes, MD      . allopurinol (ZYLOPRIM) tablet 200 mg  200 mg Oral Weber Cooks, MD   200 mg at 12/05/15 [id number]  . atorvastatin (LIPITOR) tablet 20 mg  20 mg Oral q  morning - 10a Fritzi Mandes, MD   20 mg at 12/05/15 1008  . diltiazem (CARDIZEM) tablet 60 mg  60 mg Oral TID Fritzi Mandes, MD   60 mg at 12/05/15 1008  . docusate sodium (COLACE) capsule 100 mg  100 mg Oral BID Fritzi Mandes, MD   100 mg at 12/05/15 1008  . enoxaparin (LOVENOX) injection 40 mg  40 mg Subcutaneous Q24H Darylene Price [place], [facility]      . finasteride (PROSCAR) tablet 5 mg  5 mg Oral Daily Fritzi Mandes, MD   5 mg at 12/05/15 1009  . fluticasone (FLONASE) 50 MCG/ACT nasal spray 2 spray  2 spray Each Nare Daily Fritzi Mandes, MD   2  spray at 12/05/15 1009  . morphine 2 MG/ML injection 1 mg  1 mg Intravenous Q4H PRN Bettey Costa, MD   1 mg at 12/05/15 0846  . multivitamin with minerals tablet 1 tablet  1 tablet Oral Daily Fritzi Mandes, MD   1 tablet at 12/05/15 1008  . omega-3 acid ethyl esters (LOVAZA) capsule 1 g  1 g Oral Daily Fritzi Mandes, MD   1 g at 12/05/15 1008  . ondansetron (ZOFRAN) tablet 4 mg  4 mg Oral Q6H PRN Fritzi Mandes, MD       Or  . ondansetron (ZOFRAN) injection 4 mg  4 mg Intravenous Q6H PRN Fritzi Mandes, MD      . oxyCODONE-acetaminophen (PERCOCET/ROXICET) 5-325 MG per tablet 1 tablet  1 tablet Oral Q8H PRN Fritzi Mandes, MD   1 tablet at 12/05/15 0008  . pantoprazole (PROTONIX) EC tablet 40 mg  40 mg Oral Daily Fritzi Mandes, MD   40 mg at 12/05/15 1008  . senna-docusate (Senokot-S) tablet 1 tablet  1 tablet Oral QHS PRN Fritzi Mandes, MD      . tamsulosin (FLOMAX) capsule 0.4 mg  0.4 mg Oral q morning - 10a Fritzi Mandes, MD   0.4 mg at 12/05/15 1008  . traMADol (ULTRAM) tablet 50-100 mg  50-100 mg Oral Q4H PRN Fritzi Mandes, MD   100 mg at 12/05/15 [id number]     Discharge Medications: Please see discharge summary for a list of discharge medications.  Relevant Imaging Results:  Relevant Lab Results:   Additional Information  (SSN: [id number])  Loralyn Freshwater, LCSW

## 2015-12-05 NOTE — Clinical Social Work Note (Signed)
Clinical Social Work Assessment  Patient Details  Name: Bob Christensen MRN: [id number] Date of Birth: 06-16-1941  Date of referral:  12/05/15               Reason for consult:  Facility Placement, Other (Comment Required) (SNF VS. Home Health )                Permission sought to share information with:  Chartered certified accountant granted to share information::  Yes, Verbal Permission Granted  Name::      [place]::   [place]   Relationship::     Contact Information:     Housing/Transportation Living arrangements for the past 2 months:  [place] of Information:  Patient, Adult Children, Other (Comment Required) (Sibling) Patient Interpreter Needed:  None Criminal Activity/Legal Involvement Pertinent to Current Situation/Hospitalization:  No - Comment as needed Significant Relationships:  Adult Children, Siblings, Significant Other Lives with:  Self Do you feel safe going back to the place where you live?  Yes Need for family participation in patient care:  Yes (Comment)  Care giving concerns: Patient lives alone in [place].    Social Worker assessment / plan:  Holiday representative (CSW) received verbal consult from PT that recommendation is SNF. Per RN in progression rounds oncology consult pending. CSW met with patient and his sister Arrie Aran, girlfriend Lelon Frohlich, son Bayley, and daughter Hoyle Sauer were all at bedside. CSW introduced self and explained role of CSW department. Patient was sitting up on the side of the bed eating ice cream. Patient had knee surgery in November 2016 and went home with home health. Patient reported that he lives in [place] alone and his adult children, sister, and girlfriend live in [place] as well. CSW explained that PT is recommending SNF. Patient reported that he does not know about that and prefers to go home. Patient did give CSW permission to start SNF search in [place].  CSW spoke with patient's son and daughter outside of the room. Per son he would prefer if patient goes home. Per son patient will have family support at home. Daugther reported that she is pretty sure patient has cancer and she would like to speak to the doctor. CSW made RN aware of daughter's request.   FL2 complete and faxed out. CSW will continue to follow and assist as needed.    Employment status:  Retired Forensic scientist:  Medicare PT Recommendations:  [place] / Referral to community resources:  [place]  Patient/Family's Response to care:  Patient is agreeable to SNF search however prefers to go home.   Patient/Family's Understanding of and Emotional Response to Diagnosis, Current Treatment, and Prognosis:  Patient and family were pleasant and thanked CSW for visit.   Emotional Assessment Appearance:  Appears stated age Attitude/Demeanor/Rapport:    Affect (typically observed):  Pleasant Orientation:  Oriented to Self, Oriented to Place, Fluctuating Orientation (Suspected and/or reported Sundowners), Oriented to Situation Alcohol / Substance use:  Not Applicable Psych involvement (Current and /or in the community):  No (Comment)  Discharge Needs  Concerns to be addressed:  Discharge Planning Concerns Readmission within the last 30 days:  No Current discharge risk:  Chronically ill Barriers to Discharge:  Continued Medical Work up   Loralyn Freshwater, LCSW 12/05/2015, 10:54 AM

## 2015-12-05 NOTE — Consult Note (Signed)
[facility]  Telephone:([phone]) [phone] Fax:(336) [phone]  ID: Denna Haggard OB: [birth year]  MR#: [id number]  [id number]  Patient Care Team: Birdie Sons, MD as PCP - General (Family Medicine) Manya Silvas, MD as Consulting Physician (Gastroenterology) Glenna Fellows, MD as Consulting Physician (Neurosurgery) Earnestine Leys, MD as Consulting Physician (Orthopedic Surgery) Murrell Redden, MD (Urology) Minna Merritts, MD as Consulting Physician (Cardiology)  CHIEF COMPLAINT:  Chief Complaint  Patient presents with  . Otalgia  . Dizziness  . Weakness  . Melena  . Abdominal Pain    INTERVAL HISTORY: Patient is a 75 year old male who presented to the emergency room with decreasing appetite over the last several weeks, weight loss, abdominal pain, and bloating. CT scan in the emergency room reported extensive peritoneal, omental, and mesenteric nodularity, bulky retroperitoneal lymphadenopathy and a large conglomerate mass associated with the descending and sigmoid colon. Much of the history is given by his family members. He denies any fevers or night sweats. He has had no chest pain or shortness of breath. He denies any nausea, vomiting, constipation, or diarrhea. He has no urinary complaints. Patient offers no further specific complaints.  REVIEW OF SYSTEMS:   Review of Systems  Constitutional: Positive for weight loss and malaise/fatigue. Negative for fever.  Respiratory: Negative.  Negative for cough, hemoptysis and shortness of breath.   Cardiovascular: Negative.  Negative for chest pain and leg swelling.  Gastrointestinal: Positive for abdominal pain. Negative for nausea, vomiting, diarrhea and constipation.  Genitourinary: Negative.   Musculoskeletal: Negative.   Neurological: Positive for weakness.    As per HPI. Otherwise, a complete review of systems is negatve.  PAST MEDICAL HISTORY: Past Medical History  Diagnosis Date  . History of colon  polyps 2012    a. Tubular adenoma excised by Tiffany Kocher  . History of kidney stones   . Osteoarthritis   . Brachial neuritis   . GI bleed     a. Following polypectomy  . Biceps tendon rupture 2015  . Malignant melanoma of skin of scalp ([place]) 2011    a. Excised 2001 [facility])  . PAF (paroxysmal atrial fibrillation) ([place])     a. 1997 - no known recurrence since;  b. CHA2DS2VASc = 4.  . Carotid arterial disease ([place])     a. 07/2014 Carotid U/S: bilat 0-39% stenosis.  . Degenerative joint disease     a. 2010 s/p L THA;  b. 2016 - pending L knee arthroplasty.  . H/O cardiac catheterization     a. 1997 - reportedly normal.  . Essential hypertension   . Diet-controlled diabetes mellitus ([place])   . Diabetes mellitus without complication ([place])   . Colonic mass     PAST SURGICAL HISTORY: Past Surgical History  Procedure Laterality Date  . Total hip arthroplasty Left 2010  . Melanoma on head  2011  . Lumbar laminectomy  2006    Dr Carloyn Manner  . Hand surgery  2012  . Carpal tunnel release  10/23/2011    left hand  . Lateral fusion lumbar spine, thoracotomy    . Melanoma excision Left 11/2011    Shoulder  . Cataract extraction, bilateral Bilateral 2013  . Nerve surgery Left 2011    Elbow  . Ulnar nerve repair  2010    Row  . Total knee arthroplasty Left 09/27/2015    Procedure: TOTAL KNEE ARTHROPLASTY;  Surgeon: Leanor Kail, MD;  Location: [facility] ORS;  Service: Orthopedics;  Laterality: Left;  . Joint  replacement Left     knee    FAMILY HISTORY Family History  Problem Relation Age of Onset  . Bladder Cancer Father        ADVANCED DIRECTIVES:    HEALTH MAINTENANCE: Social History  Substance Use Topics  . Smoking status: Former Smoker -- 3.00 packs/day for 55 years    Types: Cigarettes    Quit date: 12/24/1987  . Smokeless tobacco: None  . Alcohol Use: No     Colonoscopy:  PAP:  Bone density:  Lipid panel:  Allergies  Allergen Reactions  . Digoxin  Other (See Comments)    "Heart stop.  Shocked my heart"    Current Facility-Administered Medications  Medication Dose Route Frequency Provider Last Rate Last Dose  . 0.9 %  sodium chloride infusion   Intravenous Continuous Bettey Costa, MD 100 mL/hr at 12/05/15 1710    . acetaminophen (TYLENOL) tablet 650 mg  650 mg Oral Q6H PRN Fritzi Mandes, MD       Or  . acetaminophen (TYLENOL) suppository 650 mg  650 mg Rectal Q6H PRN Fritzi Mandes, MD      . allopurinol (ZYLOPRIM) tablet 200 mg  200 mg Oral Weber Cooks, MD   200 mg at 12/05/15 [id number]  . atorvastatin (LIPITOR) tablet 20 mg  20 mg Oral q morning - 10a Fritzi Mandes, MD   20 mg at 12/05/15 1008  . diltiazem (CARDIZEM) tablet 60 mg  60 mg Oral TID Fritzi Mandes, MD   60 mg at 12/05/15 1656  . docusate sodium (COLACE) capsule 100 mg  100 mg Oral BID Fritzi Mandes, MD   100 mg at 12/05/15 1008  . enoxaparin (LOVENOX) injection 40 mg  40 mg Subcutaneous Q24H Darylene Price [place], [facility]      . finasteride (PROSCAR) tablet 5 mg  5 mg Oral Daily Fritzi Mandes, MD   5 mg at 12/05/15 1009  . fluticasone (FLONASE) 50 MCG/ACT nasal spray 2 spray  2 spray Each Nare Daily Fritzi Mandes, MD   2 spray at 12/05/15 1009  . morphine 2 MG/ML injection 1 mg  1 mg Intravenous Q4H PRN Bettey Costa, MD   1 mg at 12/05/15 0846  . omega-3 acid ethyl esters (LOVAZA) capsule 1 g  1 g Oral Daily Fritzi Mandes, MD   1 g at 12/05/15 1008  . ondansetron (ZOFRAN) tablet 4 mg  4 mg Oral Q6H PRN Fritzi Mandes, MD       Or  . ondansetron (ZOFRAN) injection 4 mg  4 mg Intravenous Q6H PRN Fritzi Mandes, MD   4 mg at 12/05/15 1656  . oxyCODONE-acetaminophen (PERCOCET/ROXICET) 5-325 MG per tablet 1 tablet  1 tablet Oral Q8H PRN Fritzi Mandes, MD   1 tablet at 12/05/15 1345  . pantoprazole (PROTONIX) EC tablet 40 mg  40 mg Oral Daily Fritzi Mandes, MD   40 mg at 12/05/15 1008  . senna-docusate (Senokot-S) tablet 1 tablet  1 tablet Oral QHS PRN Fritzi Mandes, MD      . tamsulosin (FLOMAX) capsule 0.4 mg  0.4 mg Oral q morning  - 10a Fritzi Mandes, MD   0.4 mg at 12/05/15 1008  . traMADol (ULTRAM) tablet 50-100 mg  50-100 mg Oral Q4H PRN Fritzi Mandes, MD   100 mg at 12/05/15 0622    OBJECTIVE: Filed Vitals:   12/05/15 1010 12/05/15 1630  BP: 119/48 118/54  Pulse: 66 58  Temp: 97.5 F (36.4 C) 97.6 F (36.4 C)  Resp: 17 17  Body mass index is 26.62 kg/(m^2).    ECOG FS:1 - Symptomatic but completely ambulatory  General: Well-developed, well-nourished, no acute distress. Eyes: Pink conjunctiva, anicteric sclera. HEENT: Normocephalic, moist mucous membranes, clear oropharnyx. Lungs: Clear to auscultation bilaterally. Heart: Regular rate and rhythm. No rubs, murmurs, or gallops. Abdomen: Mildly distended. Musculoskeletal: No edema, cyanosis, or clubbing. Neuro: Alert, answering all questions appropriately. Cranial nerves grossly intact. Skin: No rashes or petechiae noted. Psych: Normal affect. Lymphatics: No cervical, calvicular, axillary or inguinal LAD.   LAB RESULTS:  Lab Results  Component Value Date   NA 142 12/05/2015   K 4.5 12/05/2015   CL 110 12/05/2015   CO2 26 12/05/2015   GLUCOSE 87 12/05/2015   BUN 62* 12/05/2015   CREATININE 1.74* 12/05/2015   CALCIUM 12.4* 12/05/2015   PROT 6.1* 12/04/2015   ALBUMIN 3.4* 12/04/2015   AST 27 12/04/2015   ALT 18 12/04/2015   ALKPHOS 73 12/04/2015   BILITOT 1.0 12/04/2015   GFRNONAA 37* 12/05/2015   GFRAA 43* 12/05/2015    Lab Results  Component Value Date   WBC 10.8* 12/04/2015   NEUTROABS 5.2 05/18/2009   HGB 10.8* 12/04/2015   HCT 34.1* 12/04/2015   MCV 88.0 12/04/2015   PLT 336 12/04/2015     STUDIES:   ASSESSMENT: Abnormality seen on CT scan suspicious for underlying malignancy.  PLAN:    1. Malignancy: CT scan results reviewed independently and reported above. This is highly suspicious for underlying malignancy. Colon cancer is a possibility although patient had reported normal colonoscopy in December 2015 and CEA is within  normal limits. Appreciate GI input and proceed with colonoscopy in the morning as planned. Lymphoma is also a concern, therefore if colonoscopy is unrevealing will recommend CT-guided biopsy of his conglomerate mass in the left lower quadrant. Once a biopsy and diagnosis is obtained, will further comment on additional diagnostic studies and treatment. 2. Hypercalcemia: Improving. Likely secondary to malignancy. Typically do not see elevated calcium levels in colon cancer, making this lesion more suspicious for lymphoma. Continue IV fluids as ordered. Biopsy as above.  Appreciate consult, will follow.   Lloyd Huger, MD   12/05/2015 5:36 PM

## 2015-12-05 NOTE — Progress Notes (Signed)
[facility] NAME: Bob Christensen    MR#:  [id number]  DATE OF BIRTH:  [birth year]  SUBJECTIVE:   Patient is complaining of abdominal fullness. Denies bloody stools or melena.  REVIEW OF SYSTEMS:    Review of Systems  Constitutional: Negative for fever, chills and malaise/fatigue.  HENT: Negative for sore throat.   Eyes: Negative for blurred vision.  Respiratory: Negative for cough, hemoptysis, shortness of breath and wheezing.   Cardiovascular: Negative for chest pain, palpitations and leg swelling.  Gastrointestinal: Positive for nausea and abdominal pain. Negative for vomiting, diarrhea and blood in stool.  Genitourinary: Negative for dysuria.  Musculoskeletal: Negative for back pain.  Neurological: Negative for dizziness, tremors and headaches.  Endo/Heme/Allergies: Does not bruise/bleed easily.    Tolerating Diet: yes      DRUG ALLERGIES:   Allergies  Allergen Reactions  . Digoxin Other (See Comments)    "Heart stop.  Shocked my heart"    VITALS:  Blood pressure 133/60, pulse 63, temperature 97.5 F (36.4 C), temperature source Oral, resp. rate 18, height 5\' 7"  (1.702 m), weight 77.111 kg (170 lb), SpO2 93 %.  PHYSICAL EXAMINATION:   Physical Exam  Constitutional: He is oriented to person, place, and time and well-developed, well-nourished, and in no distress. No distress.  HENT:  Head: Normocephalic.  Eyes: No scleral icterus.  Neck: Normal range of motion. Neck supple. No JVD present. No tracheal deviation present.  Cardiovascular: Normal rate, regular rhythm and normal heart sounds.  Exam reveals no gallop and no friction rub.   No murmur heard. Pulmonary/Chest: Effort normal and breath sounds normal. No respiratory distress. He has no wheezes. He has no rales. He exhibits no tenderness.  Abdominal: Soft. Bowel sounds are normal. He exhibits distension and mass. There is no tenderness. There is no  rebound and no guarding.  Musculoskeletal: Normal range of motion. He exhibits no edema.  Neurological: He is alert and oriented to person, place, and time.  Skin: Skin is warm. No rash noted. No erythema.  Psychiatric: Affect and judgment normal.      LABORATORY PANEL:   CBC  Recent Labs Lab 12/04/15 0946  WBC 10.8*  HGB 10.8*  HCT 34.1*  PLT 336   ------------------------------------------------------------------------------------------------------------------  Chemistries   Recent Labs Lab 12/04/15 0946 12/05/15 0543  NA 141 142  K 5.0 4.5  CL 104 110  CO2 26 26  GLUCOSE 94 87  BUN 72* 62*  CREATININE 2.17* 1.74*  CALCIUM 14.0* 12.4*  AST 27  --   ALT 18  --   ALKPHOS 73  --   BILITOT 1.0  --    ------------------------------------------------------------------------------------------------------------------  Cardiac Enzymes No results for input(s): TROPONINI in the last 168 hours. ------------------------------------------------------------------------------------------------------------------  RADIOLOGY:  Ct Abdomen Pelvis Wo Contrast  12/04/2015  CLINICAL DATA:  Bloating, decreased appetite, weakness. Black stools for 1 week. Diffuse abdominal pain. EXAM: CT ABDOMEN AND PELVIS WITHOUT CONTRAST TECHNIQUE: Multidetector CT imaging of the abdomen and pelvis was performed following the standard protocol without IV contrast. COMPARISON:  12/09/2010 FINDINGS: Adenopathy noted in the right cardiophrenic angle with lymph node measuring 2.3 cm on image 11. Small nodules noted along the right hemidiaphragm, the largest on image 10 measures 11 mm. Small subpleural nodule in referral nodule adjacent to the diaphragm on the same image. Trace left pleural effusion. Probable scarring in the lingula. Heart is borderline in size. There is diffuse omental and peritoneal nodularity. Diffuse  mesenteric nodularity. Massive retroperitoneal adenopathy. Conglomerate nodal mass in the  right periaortic space on image 42 measures 6.1 x 4.4 cm. There is a large conglomerate mass in the left abdomen and extending into the left pelvis. This measures 11.3 x 4.7 cm. This is intimately associated with the distal descending colon and proximal sigmoid colon. There is ascites noted around the liver and spleen and continued in the paracolic gutters into the pelvis. There is sigmoid diverticulosis. Stomach and small bowel grossly unremarkable. No definite focal abnormality in the liver, spleen, pancreas, right adrenal. Low-density  has small low-density areas in the lower poles of the kidneys bilaterally most likely reflects cysts but cannot be fully characterized without intravenous contrast. There is a left adrenal nodule measuring 2.8 cm. Urinary bladder is grossly unremarkable. Prostate is mildly enlarged. Prior left hip replacement. No acute bony abnormality or focal lesion. Degenerative changes in the lumbar spine. IMPRESSION: Markedly abnormal study with extensive peritoneal/ omental/ mesenteric nodularity. Bulky retroperitoneal adenopathy with a large conglomerate mass in the left lower quadrant intimately associated with the descending colon and proximal sigmoid colon. Differential considerations would include metastatic disease (possible colon primary? ) with peritoneal spread or lymphoma. Associated moderate ascites. Right cardiophrenic adenopathy. Nodularity in the right lung base could reflect metastatic disease. Trace left pleural effusion. Left adrenal nodule concerning for metastatic disease. Electronically Signed   By: Rolm Baptise M.D.   On: 12/04/2015 14:01     ASSESSMENT AND PLAN:     75 year old male with a history of proximal atrial fibrillation, colon polyps and essential hypertension who presents with weight loss and poor appetite. He was found to have acute renal failure and hypercalcemia.  1. Acute renal failure with hypercalcemia: This is likely secondary to prerenal  azotemia due to poor by mouth intake with nausea and vomiting. Cranial has improved along with calcium. Continue IV fluids. Continue to monitor creatinine and calcium in a.m. Patient's basic creatinine is 1.14  2. Bulky retroperitoneal adenopathy with a large conglomerate mass in the left lower quadrant intimately associated with the descending colon and proximal sigmoid colon: Differential includes metastatic disease with peritoneal spread or lymphoma. GI and oncology have been consulted. Plan is for likely colonoscopy or sigmoidoscopy. Patient had a colonoscopy approximate 3 months ago in which he states he had polyps removed. I do not have the pathology on those polyps.  3. Essential hypertension: Blood pressure is adequately controlled on diltiazem. Due to renal failure lisinopril and HCTZ have been discontinued.  4. BPH: Continue finasteride and tamsulosin.  5. History of paroxysmal atrial fibrillation: Continue diltiazem. His cardiologist is Dr. Kennyth Arnold in. He is not on anticoagulation. At this time I would not start anticoagulation due to the issues above.   Management plans discussed with the patient and he is in agreement.  CODE STATUS: Full  TOTAL TIME TAKING CARE OF THIS PATIENT: 30 minutes.     POSSIBLE D/C in 3-4 days, DEPENDING ON CLINICAL CONDITION.   Billyjoe Go M.D on 12/05/2015 at 9:57 AM  Between 7am to 6pm - Pager - [phone] After 6pm go to [email or web address] - password [email or web address] [facility]  Office  [phone]  CC: Primary care physician; Lelon Huh, MD  Note: This dictation was prepared with Dragon dictation along with smaller phrase technology. Any transcriptional errors that result from this process are unintentional.

## 2015-12-05 NOTE — Clinical Social Work Placement (Signed)
   CLINICAL SOCIAL WORK PLACEMENT  NOTE  Date:  12/05/2015  Patient Details  Name: Bob Christensen MRN: [id number] Date of Birth: October 28, 1941  Clinical Social Work is seeking post-discharge placement for this patient at the [place] level of care (*CSW will initial, date and re-position this form in  chart as items are completed):  Yes   Patient/family provided with [facility] Work Department's list of facilities offering this level of care within the geographic area requested by the patient (or if unable, by the patient's family).  Yes   Patient/family informed of their freedom to choose among providers that offer the needed level of care, that participate in Medicare, Medicaid or managed care program needed by the patient, have an available bed and are willing to accept the patient.  Yes   Patient/family informed of [facility]'s ownership interest in [facility] and [facility], as well as of the fact that they are under no obligation to receive care at these facilities.  PASRR submitted to EDS on 12/05/15     PASRR number received on 12/05/15     Existing PASRR number confirmed on       FL2 transmitted to all facilities in geographic area requested by pt/family on 12/05/15     FL2 transmitted to all facilities within larger geographic area on       Patient informed that his/her managed care company has contracts with or will negotiate with certain facilities, including the following:            Patient/family informed of bed offers received.  Patient chooses bed at       Physician recommends and patient chooses bed at      Patient to be transferred to   on  .  Patient to be transferred to facility by       Patient family notified on   of transfer.  Name of family member notified:        PHYSICIAN       Additional Comment:    _______________________________________________ Loralyn Freshwater, LCSW 12/05/2015, 10:50 AM

## 2015-12-06 ENCOUNTER — Inpatient Hospital Stay: Payer: Medicare Other | Admitting: Anesthesiology

## 2015-12-06 ENCOUNTER — Encounter: Payer: Self-pay | Admitting: *Deleted

## 2015-12-06 ENCOUNTER — Encounter
Admission: EM | Disposition: A | Discharge status: Skilled Nursing Facility | Payer: Self-pay | Source: Home / Self Care | Attending: Internal Medicine

## 2015-12-06 DIAGNOSIS — R198 Other specified symptoms and signs involving the digestive system and abdomen: Secondary | ICD-10-CM | POA: Insufficient documentation

## 2015-12-06 DIAGNOSIS — R933 Abnormal findings on diagnostic imaging of other parts of digestive tract: Secondary | ICD-10-CM

## 2015-12-06 HISTORY — PX: COLONOSCOPY WITH PROPOFOL: SHX5780

## 2015-12-06 LAB — BASIC METABOLIC PANEL
ANION GAP: 8 (ref 5–15)
BUN: 54 mg/dL — ABNORMAL HIGH (ref 6–20)
CALCIUM: 11.9 mg/dL — AB (ref 8.9–10.3)
CO2: 25 mmol/L (ref 22–32)
Chloride: 107 mmol/L (ref 101–111)
Creatinine, Ser: 1.69 mg/dL — ABNORMAL HIGH (ref 0.61–1.24)
GFR, EST AFRICAN AMERICAN: 44 mL/min — AB (ref >60)
GFR, EST NON AFRICAN AMERICAN: 38 mL/min — AB (ref >60)
Glucose, Bld: 77 mg/dL (ref 65–99)
Potassium: 4.1 mmol/L (ref 3.5–5.1)
SODIUM: 140 mmol/L (ref 135–145)

## 2015-12-06 LAB — CBC
HCT: 30.3 % — ABNORMAL LOW (ref 40.0–52.0)
HEMOGLOBIN: 9.6 g/dL — AB (ref 13.0–18.0)
MCH: 28.5 pg (ref 26.0–34.0)
MCHC: 31.7 g/dL — ABNORMAL LOW (ref 32.0–36.0)
MCV: 89.9 fL (ref 80.0–100.0)
Platelets: 269 10*3/uL (ref 150–440)
RBC: 3.38 MIL/uL — AB (ref 4.40–5.90)
RDW: 16.3 % — ABNORMAL HIGH (ref 11.5–14.5)
WBC: 8.9 10*3/uL (ref 3.8–10.6)

## 2015-12-06 SURGERY — COLONOSCOPY WITH PROPOFOL
Anesthesia: General

## 2015-12-06 MED ORDER — PROPOFOL 500 MG/50ML IV EMUL
INTRAVENOUS | Status: DC | PRN
  Administered 2015-12-06: 120 ug/kg/min via INTRAVENOUS

## 2015-12-06 MED ORDER — PROPOFOL 10 MG/ML IV BOLUS
INTRAVENOUS | Status: DC | PRN
  Administered 2015-12-06: 30 mg via INTRAVENOUS

## 2015-12-06 MED ORDER — FENTANYL CITRATE (PF) 100 MCG/2ML IJ SOLN
INTRAMUSCULAR | Status: DC | PRN
  Administered 2015-12-06: 50 ug via INTRAVENOUS

## 2015-12-06 MED ORDER — HYDROMORPHONE HCL 1 MG/ML IJ SOLN
0.5000 mg | INTRAMUSCULAR | Status: DC | PRN
  Administered 2015-12-06 – 2015-12-07 (×3): 0.5 mg via INTRAVENOUS
  Filled 2015-12-06 (×3): qty 1

## 2015-12-06 MED ORDER — MIDAZOLAM HCL 2 MG/2ML IJ SOLN
INTRAMUSCULAR | Status: DC | PRN
Start: 2015-12-06 — End: 2015-12-06
  Administered 2015-12-06: 1 mg via INTRAVENOUS

## 2015-12-06 NOTE — Anesthesia Postprocedure Evaluation (Signed)
Anesthesia Post Note  Patient: Bob Christensen  Procedure(s) Performed: Procedure(s) (LRB): COLONOSCOPY WITH PROPOFOL (N/A)  Patient location during evaluation: PACU Anesthesia Type: General Level of consciousness: sedated Pain management: pain level controlled Vital Signs Assessment: post-procedure vital signs reviewed and stable Respiratory status: spontaneous breathing, respiratory function stable and patient connected to nasal cannula oxygen Cardiovascular status: stable Anesthetic complications: no    Last Vitals:  Filed Vitals:   12/06/15 0749 12/06/15 1152  BP: 143/61 119/57  Pulse: 69 71  Temp: 36.8 C 36.4 C  Resp: 16 17    Last Pain:  Filed Vitals:   12/06/15 1152  PainSc: Asleep                 VAN STAVEREN,Demesha Boorman

## 2015-12-06 NOTE — Progress Notes (Signed)
Colonoscopy results noted with no samples taken. Appreciate GI input. Will order a CT-guided biopsy of left lower quadrant conglomerate mass.

## 2015-12-06 NOTE — Progress Notes (Signed)
Called into pt room son stated machine making noise , iv chamber made grinding sound still functional  So iv chamber changed out , pt son called nurse again stating pt is all tangled up pt had turned on his rt side and iv tubing was under him  Nurse untangled iv tubing pt family asked for pain med prn iv morphine given pt stated" that don't do nothing"

## 2015-12-06 NOTE — Progress Notes (Signed)
[name] NAME: Bob Christensen    MR#:  [id number]  DATE OF BIRTH:  1941-01-19  SUBJECTIVE:   Patient is continuing to have increased abdominal girth. He denies nausea REVIEW OF SYSTEMS:    Review of Systems  Constitutional: Negative for fever, chills and malaise/fatigue.  HENT: Negative for sore throat.   Eyes: Negative for blurred vision.  Respiratory: Negative for cough, hemoptysis, shortness of breath and wheezing.   Cardiovascular: Negative for chest pain, palpitations and leg swelling.  Gastrointestinal: Positive for abdominal pain and diarrhea (from colon prep). Negative for vomiting and blood in stool.  Genitourinary: Negative for dysuria.  Musculoskeletal: Negative for back pain.  Neurological: Negative for dizziness, tremors and headaches.  Endo/Heme/Allergies: Does not bruise/bleed easily.    Tolerating Diet: yes      DRUG ALLERGIES:   Allergies  Allergen Reactions  . Digoxin Other (See Comments)    "Heart stop.  Shocked my heart"    VITALS:  Blood pressure 143/61, pulse 69, temperature 98.3 F (36.8 C), temperature source Oral, resp. rate 16, height 5\' 7"  (1.702 m), weight 77.111 kg (170 lb), SpO2 91 %.  PHYSICAL EXAMINATION:   Physical Exam  Constitutional: He is oriented to person, place, and time and well-developed, well-nourished, and in no distress. No distress.  HENT:  Head: Normocephalic.  Eyes: No scleral icterus.  Neck: Normal range of motion. Neck supple. No JVD present. No tracheal deviation present.  Cardiovascular: Normal rate and regular rhythm.  Exam reveals no gallop and no friction rub.   Murmur heard. Pulmonary/Chest: Effort normal and breath sounds normal. No respiratory distress. He has no wheezes. He has no rales. He exhibits no tenderness.  Abdominal: Soft. He exhibits distension and mass. There is no tenderness. There is no rebound and no guarding.  Hypoactive bowel  sounds  Musculoskeletal: Normal range of motion. He exhibits no edema.  Neurological: He is alert and oriented to person, place, and time.  Skin: Skin is warm. No rash noted. No erythema.  Psychiatric: Affect and judgment normal.      LABORATORY PANEL:   CBC  Recent Labs Lab 12/06/15 0600  WBC 8.9  HGB 9.6*  HCT 30.3*  PLT 269   ------------------------------------------------------------------------------------------------------------------  Chemistries   Recent Labs Lab 12/04/15 0946  12/06/15 0600  NA 141  < > 140  K 5.0  < > 4.1  CL 104  < > 107  CO2 26  < > 25  GLUCOSE 94  < > 77  BUN 72*  < > 54*  CREATININE 2.17*  < > 1.69*  CALCIUM 14.0*  < > 11.9*  AST 27  --   --   ALT 18  --   --   ALKPHOS 73  --   --   BILITOT 1.0  --   --   < > = values in this interval not displayed. ------------------------------------------------------------------------------------------------------------------  Cardiac Enzymes No results for input(s): TROPONINI in the last 168 hours. ------------------------------------------------------------------------------------------------------------------  RADIOLOGY:  Ct Abdomen Pelvis Wo Contrast  12/04/2015  CLINICAL DATA:  Bloating, decreased appetite, weakness. Black stools for 1 week. Diffuse abdominal pain. EXAM: CT ABDOMEN AND PELVIS WITHOUT CONTRAST TECHNIQUE: Multidetector CT imaging of the abdomen and pelvis was performed following the standard protocol without IV contrast. COMPARISON:  12/09/2010 FINDINGS: Adenopathy noted in the right cardiophrenic angle with lymph node measuring 2.3 cm on image 11. Small nodules noted along the right hemidiaphragm, the largest  on image 10 measures 11 mm. Small subpleural nodule in referral nodule adjacent to the diaphragm on the same image. Trace left pleural effusion. Probable scarring in the lingula. Heart is borderline in size. There is diffuse omental and peritoneal nodularity. Diffuse  mesenteric nodularity. Massive retroperitoneal adenopathy. Conglomerate nodal mass in the right periaortic space on image 42 measures 6.1 x 4.4 cm. There is a large conglomerate mass in the left abdomen and extending into the left pelvis. This measures 11.3 x 4.7 cm. This is intimately associated with the distal descending colon and proximal sigmoid colon. There is ascites noted around the liver and spleen and continued in the paracolic gutters into the pelvis. There is sigmoid diverticulosis. Stomach and small bowel grossly unremarkable. No definite focal abnormality in the liver, spleen, pancreas, right adrenal. Low-density  has small low-density areas in the lower poles of the kidneys bilaterally most likely reflects cysts but cannot be fully characterized without intravenous contrast. There is a left adrenal nodule measuring 2.8 cm. Urinary bladder is grossly unremarkable. Prostate is mildly enlarged. Prior left hip replacement. No acute bony abnormality or focal lesion. Degenerative changes in the lumbar spine. IMPRESSION: Markedly abnormal study with extensive peritoneal/ omental/ mesenteric nodularity. Bulky retroperitoneal adenopathy with a large conglomerate mass in the left lower quadrant intimately associated with the descending colon and proximal sigmoid colon. Differential considerations would include metastatic disease (possible colon primary? ) with peritoneal spread or lymphoma. Associated moderate ascites. Right cardiophrenic adenopathy. Nodularity in the right lung base could reflect metastatic disease. Trace left pleural effusion. Left adrenal nodule concerning for metastatic disease. Electronically Signed   By: Rolm Baptise M.D.   On: 12/04/2015 14:01     ASSESSMENT AND PLAN:     75 year old male with a history of proximal atrial fibrillation, colon polyps and essential hypertension who presents with weight loss and poor appetite. He was found to have acute renal failure and  hypercalcemia.  1. Acute renal failure with hypercalcemia: This is likely secondary to prerenal azotemia due to poor by mouth intake with nausea and vomiting. Creatinine and calcium have improved. Continue IV fluids. Continue to monitor creatinine and calcium in a.m.  Hypercalcemia could also be caused from possible lymphoma. The fact that is responding to IV fluids is reassuring.   2. Bulky retroperitoneal adenopathy with a large conglomerate mass in the left lower quadrant intimately associated with the descending colon and proximal sigmoid colon: Differential includes metastatic disease with peritoneal spread or lymphoma: Plan for colonoscopy today. Patient had a colonoscopy about a year ago with no mention of any colon cancer. It is likely that this could be lymphoma. Appreciate oncology consultation.  Patient may need a CT-guided biopsy of the mass in the left lower quadrant. Once the biopsy and diagnosis obtained, oncology will better be able to assist the patient.   3. Essential hypertension: Blood pressure is adequately controlled on diltiazem. Due to renal failure lisinopril and HCTZ have been discontinued. As creatinine improves, patient may be able to restart this medication.  4. BPH: Continue finasteride and tamsulosin.  5. History of paroxysmal atrial fibrillation: Continue diltiazem. His cardiologist is Dr. [name]. He is not on anticoagulation. At this time I would not start anticoagulation due to the issues above and need for possible biopsy.   Management plans discussed with the patient and he is in agreement.  CODE STATUS: Full  TOTAL TIME TAKING CARE OF THIS PATIENT: 30 minutes.  Physical therapy recommends skilled nursing facility, social worker has  spoken with the family.   POSSIBLE D/C in 1-2 days days, DEPENDING ON CLINICAL CONDITION.   Kairos Panetta M.D on 12/06/2015 at 10:33 AM  Between 7am to 6pm - Pager - [phone] After 6pm go to [email or web address] - password  [facility]  Office  [phone]  CC: Primary care physician; Lelon Huh, MD  Note: This dictation was prepared with Dragon dictation along with smaller phrase technology. Any transcriptional errors that result from this process are unintentional.

## 2015-12-06 NOTE — Transfer of Care (Signed)
Immediate Anesthesia Transfer of Care Note  Patient: Bob Christensen  Procedure(s) Performed: Procedure(s): COLONOSCOPY WITH PROPOFOL (N/A)  Patient Location: PACU  Anesthesia Type:General  Level of Consciousness: sedated  Airway & Oxygen Therapy: Patient Spontanous Breathing and Patient connected to nasal cannula oxygen  Post-op Assessment: Report given to RN and Post -op Vital signs reviewed and stable  Post vital signs: Reviewed and stable  Last Vitals:  Filed Vitals:   12/06/15 0356 12/06/15 0749  BP: 123/52 143/61  Pulse: 65 69  Temp: 36.7 C 36.8 C  Resp: 18 16    Complications: No apparent anesthesia complications

## 2015-12-06 NOTE — Progress Notes (Signed)
Pt stable post colonoscopy.  Report called to [place], Therapist, sports.  Bob Christensen transported back to room.

## 2015-12-06 NOTE — Progress Notes (Signed)
Physical Therapy Treatment Patient Details Name: Bob Christensen MRN: [id number] DOB: 04-14-1941 Today's Date: December 30, 2015    History of Present Illness Pt admitted for acute renal failure. Pt with recent L TKR in Nov. 2016. Pt now with positive mass in L lower quadrant and colon with possible mets. Pt with complaints of weight loss, poor appetite, dizziness and bloating x 2 weeks. Pt with history of afib, CAD, HTN, and DJD. Pt confused at this time, only oriented to self.     PT Comments    Pt at procedure (colonoscopy), will re-attempt at later time when appropriate.  Follow Up Recommendations        Equipment Recommendations       Recommendations for Other Services       Precautions / Restrictions Restrictions Weight Bearing Restrictions: No    Mobility  Bed Mobility                  Transfers                    Ambulation/Gait                 Stairs            Wheelchair Mobility    Modified Rankin (Stroke Patients Only)       Balance                                    Cognition                            Exercises      General Comments        Pertinent Vitals/Pain      Home Living                      Prior Function            PT Goals (current goals can now be found in the care plan section)      Frequency       PT Plan      Co-evaluation             End of Session           Time:  -     Charges:                       G Codes:      Bob Christensen 30-Dec-2015, 10:50 AM  Bob Christensen, PTA

## 2015-12-06 NOTE — Progress Notes (Signed)
Pt. Started getting confused and agitated early in the AM. Up to [facility] multiple times. VSS. Pt. C/o abdominal pain. Pain meds given per MAR. Incontinent of stool at times. NPO for colonscopy today. Pt. Very restless throughout the night. Pt. Agitated about having so many BM's but explained to it was d/t him having procedure and reassured the BM's would stop soon. Will continue to monitor.

## 2015-12-06 NOTE — Care Management Important Message (Signed)
Important Message  Patient Details  Name: Bob Christensen MRN: [id number] Date of Birth: 1941/02/24   Medicare Important Message Given:  Yes    [name] 12/06/2015, 10:46 AM

## 2015-12-06 NOTE — Anesthesia Preprocedure Evaluation (Addendum)
Anesthesia Evaluation  Patient identified by MRN, date of birth, ID band Patient awake    Reviewed: Allergy & Precautions, NPO status , Patient's Chart, lab work & pertinent test results  History of Anesthesia Complications (+) MALIGNANT HYPERTHERMIA  Airway Mallampati: II       Dental no notable dental hx.    Pulmonary former smoker,     + decreased breath sounds      Cardiovascular Exercise Tolerance: Poor hypertension, Pt. on medications + Peripheral Vascular Disease   Rhythm:Irregular     Neuro/Psych    GI/Hepatic GERD  ,  Endo/Other  diabetes, Poorly Controlled, Type 2, Oral Hypoglycemic Agents  Renal/GU CRFRenal disease     Musculoskeletal   Abdominal Normal abdominal exam  (+)   Peds  Hematology  (+) anemia ,   Anesthesia Other Findings   Reproductive/Obstetrics                            Anesthesia Physical Anesthesia Plan  ASA: III  Anesthesia Plan: General   Post-op Pain Management:    Induction: Intravenous  Airway Management Planned: Nasal Cannula  Additional Equipment:   Intra-op Plan:   Post-operative Plan:   Informed Consent: I have reviewed the patients History and Physical, chart, labs and discussed the procedure including the risks, benefits and alternatives for the proposed anesthesia with the patient or authorized representative who has indicated his/her understanding and acceptance.     Plan Discussed with:   Anesthesia Plan Comments:         Anesthesia Quick Evaluation

## 2015-12-06 NOTE — Op Note (Signed)
[facility] Gastroenterology Patient Name: Bob Christensen Procedure Date: 12/06/2015 1:53 PM MRN: [id number] Account #: [id number] Date of Birth: 1941/02/18 Admit Type: Inpatient Age: 75 Room: [facility] ENDO ROOM 4 Gender: Male Note Status: Finalized Procedure:         Colonoscopy Indications:       Abnormal CT of the GI tract Providers:         Lucilla Lame, MD Medicines:         Propofol per Anesthesia Complications:     No immediate complications. Procedure:         Pre-Anesthesia Assessment:                    - Prior to the procedure, a History and Physical was                     performed, and patient medications and allergies were                     reviewed. The patient's tolerance of previous anesthesia                     was also reviewed. The risks and benefits of the procedure                     and the sedation options and risks were discussed with the                     patient. All questions were answered, and informed consent                     was obtained. Prior Anticoagulants: The patient has taken                     no previous anticoagulant or antiplatelet agents. ASA                     Grade Assessment: II - A patient with mild systemic                     disease. After reviewing the risks and benefits, the                     patient was deemed in satisfactory condition to undergo                     the procedure.                    After obtaining informed consent, the colonoscope was                     passed under direct vision. Throughout the procedure, the                     patient's blood pressure, pulse, and oxygen saturations                     were monitored continuously. The Colonoscope was                     introduced through the anus and advanced to the the cecum,                     identified by appendiceal orifice and ileocecal  valve. The                     colonoscopy was performed without difficulty. The patient                      tolerated the procedure well. The quality of the bowel                     preparation was excellent. Findings:      The perianal and digital rectal examinations were normal.      Multiple small-mouthed diverticula were found in the sigmoid colon.      Non-bleeding internal hemorrhoids were found during retroflexion. The       hemorrhoids were Grade II (internal hemorrhoids that prolapse but reduce       spontaneously). Impression:        - Diverticulosis in the sigmoid colon.                    - Non-bleeding internal hemorrhoids.                    - No specimens collected. Recommendation:    - Return patient to hospital ward for ongoing care. Procedure Code(s): --- Professional ---                    [phone], Colonoscopy, flexible; diagnostic, including                     collection of specimen(s) by brushing or washing, when                     performed (separate procedure) Diagnosis Code(s): --- Professional ---                    R93.3, Abnormal findings on diagnostic imaging of other                     parts of digestive tract CPT copyright 2014 American Medical Association. All rights reserved. The codes documented in this report are preliminary and upon coder review may  be revised to meet current compliance requirements. Lucilla Lame, MD 12/06/2015 1:56:14 PM This report has been signed electronically. Number of Addenda: 0 Note Initiated On: 12/06/2015 1:53 PM      [facility]

## 2015-12-07 ENCOUNTER — Inpatient Hospital Stay: Payer: Medicare Other

## 2015-12-07 ENCOUNTER — Encounter: Payer: Self-pay | Admitting: Radiology

## 2015-12-07 DIAGNOSIS — D649 Anemia, unspecified: Secondary | ICD-10-CM

## 2015-12-07 DIAGNOSIS — R188 Other ascites: Secondary | ICD-10-CM

## 2015-12-07 LAB — BASIC METABOLIC PANEL
Anion gap: 8 (ref 5–15)
BUN: 55 mg/dL — AB (ref 6–20)
CALCIUM: 11.9 mg/dL — AB (ref 8.9–10.3)
CO2: 24 mmol/L (ref 22–32)
CREATININE: 1.73 mg/dL — AB (ref 0.61–1.24)
Chloride: 114 mmol/L — ABNORMAL HIGH (ref 101–111)
GFR calc Af Amer: 43 mL/min — ABNORMAL LOW (ref >60)
GFR, EST NON AFRICAN AMERICAN: 37 mL/min — AB (ref >60)
Glucose, Bld: 77 mg/dL (ref 65–99)
Potassium: 4.3 mmol/L (ref 3.5–5.1)
SODIUM: 146 mmol/L — AB (ref 135–145)

## 2015-12-07 LAB — BODY FLUID CELL COUNT WITH DIFFERENTIAL
EOS FL: 0 %
LYMPHS FL: 87 %
Monocyte-Macrophage-Serous Fluid: 11 %
NEUTROPHIL FLUID: 2 %
OTHER CELLS FL: 0 %
Total Nucleated Cell Count, Fluid: 639 cu mm

## 2015-12-07 LAB — APTT: aPTT: 31 seconds (ref 24–36)

## 2015-12-07 LAB — PROTIME-INR
INR: 1.27
PROTHROMBIN TIME: 16 s — AB (ref 11.4–15.0)

## 2015-12-07 MED ORDER — FENTANYL CITRATE (PF) 100 MCG/2ML IJ SOLN
INTRAMUSCULAR | Status: AC
  Filled 2015-12-07: qty 4

## 2015-12-07 MED ORDER — ACETAMINOPHEN 325 MG PO TABS: 650.0000 mg | ORAL_TABLET | Freq: Four times a day (QID) | ORAL | Status: DC | PRN

## 2015-12-07 MED ORDER — MIDAZOLAM HCL 5 MG/5ML IJ SOLN
INTRAMUSCULAR | Status: AC
  Filled 2015-12-07: qty 5

## 2015-12-07 MED ORDER — OXYCODONE-ACETAMINOPHEN 5-325 MG PO TABS
1.0000 | ORAL_TABLET | Freq: Three times a day (TID) | ORAL | Status: DC | PRN
  Administered 2015-12-08: 1 via ORAL
  Filled 2015-12-07: qty 1

## 2015-12-07 MED ORDER — MIDAZOLAM HCL 2 MG/2ML IJ SOLN
INTRAMUSCULAR | Status: AC | PRN
  Administered 2015-12-07: 1 mg via INTRAVENOUS

## 2015-12-07 MED ORDER — ACETAMINOPHEN 650 MG RE SUPP: 650.0000 mg | Freq: Four times a day (QID) | RECTAL | Status: DC | PRN

## 2015-12-07 MED ORDER — HEPARIN SOD (PORK) LOCK FLUSH 100 UNIT/ML IV SOLN
INTRAVENOUS | Status: AC
Start: 2015-12-07 — End: 2015-12-07
  Filled 2015-12-07: qty 5

## 2015-12-07 MED ORDER — TRAMADOL HCL 50 MG PO TABS: 50.0000 mg | ORAL_TABLET | ORAL | Status: DC | PRN

## 2015-12-07 MED ORDER — HYDROMORPHONE HCL 1 MG/ML IJ SOLN
1.0000 mg | INTRAMUSCULAR | Status: DC | PRN
  Administered 2015-12-07 – 2015-12-08 (×5): 1 mg via INTRAVENOUS
  Filled 2015-12-07 (×6): qty 1

## 2015-12-07 MED ORDER — FENTANYL CITRATE (PF) 100 MCG/2ML IJ SOLN
INTRAMUSCULAR | Status: AC | PRN
  Administered 2015-12-07: 50 ug via INTRAVENOUS

## 2015-12-07 NOTE — Care Management (Signed)
RNCM to follow for home health needs (history of [facility]). CT biopsy today.

## 2015-12-07 NOTE — Progress Notes (Signed)
Bob Christensen  [phone] Fax:([phone]) [phone]  ID: Denna Haggard OB: September 13, 1941  MR#: [id number]  [id number]  Patient Care Team: Birdie Sons, MD as PCP - General (Family Medicine) Manya Silvas, MD as Consulting Physician (Gastroenterology) Glenna Fellows, MD as Consulting Physician (Neurosurgery) Earnestine Leys, MD as Consulting Physician (Orthopedic Surgery) Murrell Redden, MD (Urology) Minna Merritts, MD as Consulting Physician (Cardiology)  CHIEF COMPLAINT:  Chief Complaint  Patient presents with  . Otalgia  . Dizziness  . Weakness  . Melena  . Abdominal Pain    INTERVAL HISTORY: Patient with CT-guided biopsy of retroperitoneal mass yesterday. Tolerated the procedure well. Patient more comfortable today since having large volume paracentesis. No other new complaints.  REVIEW OF SYSTEMS:   Review of Systems  Constitutional: Positive for weight loss and malaise/fatigue. Negative for fever.  Respiratory: Negative.   Gastrointestinal: Positive for nausea and abdominal pain.  Neurological: Positive for weakness.    As per HPI. Otherwise, a complete review of systems is negatve.  PAST MEDICAL HISTORY: Past Medical History  Diagnosis Date  . History of colon polyps 2012    a. Tubular adenoma excised by Tiffany Kocher  . History of kidney stones   . Osteoarthritis   . Brachial neuritis   . GI bleed     a. Following polypectomy  . Biceps tendon rupture 2015  . Malignant melanoma of skin of scalp ([place]) 2011    a. Excised 2001 [facility])  . PAF (paroxysmal atrial fibrillation) ([place])     a. 1997 - no known recurrence since;  b. CHA2DS2VASc = 4.  . Carotid arterial disease ([place])     a. 07/2014 Carotid U/S: bilat 0-39% stenosis.  . Degenerative joint disease     a. 2010 s/p L THA;  b. 2016 - pending L knee arthroplasty.  . H/O cardiac catheterization     a. 1997 - reportedly normal.  . Essential hypertension   .  Diet-controlled diabetes mellitus ([place])   . Diabetes mellitus without complication ([place])   . Colonic mass     PAST SURGICAL HISTORY: Past Surgical History  Procedure Laterality Date  . Total hip arthroplasty Left 2010  . Melanoma on head  2011  . Lumbar laminectomy  2006    Dr Carloyn Manner  . Hand surgery  2012  . Carpal tunnel release  10/23/2011    left hand  . Lateral fusion lumbar spine, thoracotomy    . Melanoma excision Left 11/2011    Shoulder  . Cataract extraction, bilateral Bilateral 2013  . Nerve surgery Left 2011    Elbow  . Ulnar nerve repair  2010    Row  . Total knee arthroplasty Left 09/27/2015    Procedure: TOTAL KNEE ARTHROPLASTY;  Surgeon: Leanor Kail, MD;  Location: [facility] ORS;  Service: Orthopedics;  Laterality: Left;  . Joint replacement Left     knee  . Colonoscopy with propofol N/A 12/06/2015    Procedure: COLONOSCOPY WITH PROPOFOL;  Surgeon: Lucilla Lame, MD;  Location: [facility] ENDOSCOPY;  Service: Endoscopy;  Laterality: N/A;    FAMILY HISTORY Family History  Problem Relation Age of Onset  . Bladder Cancer Father        ADVANCED DIRECTIVES:    HEALTH MAINTENANCE: Social History  Substance Use Topics  . Smoking status: Former Smoker -- 3.00 packs/day for 55 years    Types: Cigarettes    Quit date: 12/24/1987  . Smokeless tobacco: None  . Alcohol Use: No  Colonoscopy:  PAP:  Bone density:  Lipid panel:  Allergies  Allergen Reactions  . Digoxin Other (See Comments)    "Heart stop.  Shocked my heart"    Current Facility-Administered Medications  Medication Dose Route Frequency Provider Last Rate Last Dose  . 0.9 %  sodium chloride infusion   Intravenous Continuous Fritzi Mandes, MD 50 mL/hr at 12/07/15 1456    . acetaminophen (TYLENOL) suppository 650 mg  650 mg Rectal Q6H PRN Lenis Noon, [facility]       Or  . acetaminophen (TYLENOL) tablet 650 mg  650 mg Oral Q6H PRN Lenis Noon, RPH      . allopurinol (ZYLOPRIM) tablet 200 mg  200 mg Oral  Weber Cooks, MD   200 mg at 12/05/15 [id number]  . atorvastatin (LIPITOR) tablet 20 mg  20 mg Oral q morning - 10a Fritzi Mandes, MD   20 mg at 12/05/15 1008  . diltiazem (CARDIZEM) tablet 60 mg  60 mg Oral TID Fritzi Mandes, MD   60 mg at 12/06/15 2035  . docusate sodium (COLACE) capsule 100 mg  100 mg Oral BID Fritzi Mandes, MD   100 mg at 12/06/15 2034  . enoxaparin (LOVENOX) injection 40 mg  40 mg Subcutaneous Q24H Darylene Price [place], RPH   40 mg at 12/06/15 2039  . fentaNYL (SUBLIMAZE) 100 MCG/2ML injection           . finasteride (PROSCAR) tablet 5 mg  5 mg Oral Daily Fritzi Mandes, MD   5 mg at 12/05/15 1009  . fluticasone (FLONASE) 50 MCG/ACT nasal spray 2 spray  2 spray Each Nare Daily Fritzi Mandes, MD   2 spray at 12/05/15 1009  . heparin lock flush 100 UNIT/ML injection           . HYDROmorphone (DILAUDID) injection 1 mg  1 mg Intravenous Q4H PRN Fritzi Mandes, MD   1 mg at 12/07/15 1852  . midazolam (VERSED) 5 MG/5ML injection           . omega-3 acid ethyl esters (LOVAZA) capsule 1 g  1 g Oral Daily Fritzi Mandes, MD   1 g at 12/05/15 1008  . ondansetron (ZOFRAN) tablet 4 mg  4 mg Oral Q6H PRN Fritzi Mandes, MD       Or  . ondansetron (ZOFRAN) injection 4 mg  4 mg Intravenous Q6H PRN Fritzi Mandes, MD   4 mg at 12/07/15 0104  . oxyCODONE-acetaminophen (PERCOCET/ROXICET) 5-325 MG per tablet 1 tablet  1 tablet Oral Q8H PRN Fritzi Mandes, MD      . pantoprazole (PROTONIX) EC tablet 40 mg  40 mg Oral Daily Fritzi Mandes, MD   40 mg at 12/05/15 1008  . senna-docusate (Senokot-S) tablet 1 tablet  1 tablet Oral QHS PRN Fritzi Mandes, MD      . tamsulosin (FLOMAX) capsule 0.4 mg  0.4 mg Oral q morning - 10a Fritzi Mandes, MD   0.4 mg at 12/05/15 1008  . traMADol (ULTRAM) tablet 50-100 mg  50-100 mg Oral Q4H PRN Lenis Noon, [facility]        OBJECTIVE: Filed Vitals:   12/07/15 1740 12/07/15 1809  BP: 138/68 142/63  Pulse: 78 73  Temp:  97.7 F (36.5 C)  Resp: 18 18     Body mass index is 26.62 kg/(m^2).    ECOG FS:2 -  Symptomatic, <50% confined to bed  General: Ill-appearing, no acute distress. Eyes: Pink conjunctiva, anicteric sclera. Lungs: Clear to auscultation bilaterally.  Heart: Regular rate and rhythm. No rubs, murmurs, or gallops. Abdomen: Mildly distended, nontender.. Musculoskeletal: No edema, cyanosis, or clubbing. Neuro: Alert, answering all questions appropriately. Cranial nerves grossly intact. Skin: No rashes or petechiae noted. Psych: Normal affect.   LAB RESULTS:  Lab Results  Component Value Date   NA 146* 12/07/2015   K 4.3 12/07/2015   CL 114* 12/07/2015   CO2 24 12/07/2015   GLUCOSE 77 12/07/2015   BUN 55* 12/07/2015   CREATININE 1.73* 12/07/2015   CALCIUM 11.9* 12/07/2015   PROT 6.1* 12/04/2015   ALBUMIN 3.4* 12/04/2015   AST 27 12/04/2015   ALT 18 12/04/2015   ALKPHOS 73 12/04/2015   BILITOT 1.0 12/04/2015   GFRNONAA 37* 12/07/2015   GFRAA 43* 12/07/2015    Lab Results  Component Value Date   WBC 8.9 12/06/2015   NEUTROABS 5.2 05/18/2009   HGB 9.6* 12/06/2015   HCT 30.3* 12/06/2015   MCV 89.9 12/06/2015   PLT 269 12/06/2015     STUDIES:   ASSESSMENT: Retroperitoneal lymphadenopathy, omental caking suspicious for underlying malignancy.  PLAN:     1. Malignancy: CT scan results reviewed independently. Colonoscopy earlier this week was unrevealing. Patient has now completed a CT-guided by biopsy of his left lower quadrant conglomerate mass. Results are pending at this time. This is highly suspicious for lymphoma. Okay to discharge from an oncology standpoint in follow-up as an outpatient next week.  2. Hypercalcemia: Improving. Likely secondary to malignancy. Typically do not see elevated calcium levels in colon cancer, making this lesion more suspicious for lymphoma. Continue IV fluids as ordered. Biopsy as above. 3. Ascites: Likely malignant. Large volume paracentesis today. Patient's symptoms have improved. 4. Anemia: Mild, monitor. 5. Disposition:  Possible discharge in the morning with follow-up as an outpatient next week.  Will follow.  Lloyd Huger, MD   12/07/2015 7:25 PM

## 2015-12-07 NOTE — Procedures (Signed)
Successful [place] LG VOL PARACENTESIS No comp Labs and cytology sent EBL 0 Full report in pacs

## 2015-12-07 NOTE — Progress Notes (Signed)
[facility] [place] NAME: Ulric Usey    MR#:  [id number]  DATE OF BIRTH:  Feb 22, 1941  SUBJECTIVE:   Patient is continuing to have increased abdominal girth and significant abdominal discomfort. Family frustrated with nursing staff during nighttime. He denies nausea REVIEW OF SYSTEMS:    Review of Systems  Constitutional: Negative for fever, chills and malaise/fatigue.  HENT: Negative for sore throat.   Eyes: Negative for blurred vision.  Respiratory: Negative for cough, hemoptysis, shortness of breath and wheezing.   Cardiovascular: Negative for chest pain, palpitations and leg swelling.  Gastrointestinal: Positive for abdominal pain and diarrhea (from colon prep). Negative for vomiting and blood in stool.  Genitourinary: Negative for dysuria.  Musculoskeletal: Negative for back pain.  Neurological: Negative for dizziness, tremors and headaches.  Endo/Heme/Allergies: Does not bruise/bleed easily.   Tolerating Diet: yes  DRUG ALLERGIES:   Allergies  Allergen Reactions  . Digoxin Other (See Comments)    "Heart stop.  Shocked my heart"    VITALS:  Blood pressure 128/65, pulse 76, temperature 97.8 F (36.6 C), temperature source Oral, resp. rate 18, height 5\' 7"  (1.702 m), weight 77.111 kg (170 lb), SpO2 92 %.  PHYSICAL EXAMINATION:   Physical Exam  Constitutional: He is oriented to person, place, and time and well-developed, well-nourished, and in no distress. No distress.  HENT:  Head: Normocephalic.  Eyes: No scleral icterus.  Neck: Normal range of motion. Neck supple. No JVD present. No tracheal deviation present.  Cardiovascular: Normal rate and regular rhythm.  Exam reveals no gallop and no friction rub.   Murmur heard. Pulmonary/Chest: Effort normal and breath sounds normal. No respiratory distress. He has no wheezes. He has no rales. He exhibits no tenderness.  Abdominal: Soft. He exhibits distension and  mass. There is no tenderness. There is no rebound and no guarding.  Hypoactive bowel sounds  Musculoskeletal: Normal range of motion. He exhibits no edema.  Neurological: He is alert and oriented to person, place, and time.  Skin: Skin is warm. No rash noted. No erythema.  Psychiatric: Affect and judgment normal.      LABORATORY PANEL:   CBC  Recent Labs Lab 12/06/15 0600  WBC 8.9  HGB 9.6*  HCT 30.3*  PLT 269   ------------------------------------------------------------------------------------------------------------------  Chemistries   Recent Labs Lab 12/04/15 0946  12/07/15 0443  NA 141  < > 146*  K 5.0  < > 4.3  CL 104  < > 114*  CO2 26  < > 24  GLUCOSE 94  < > 77  BUN 72*  < > 55*  CREATININE 2.17*  < > 1.73*  CALCIUM 14.0*  < > 11.9*  AST 27  --   --   ALT 18  --   --   ALKPHOS 73  --   --   BILITOT 1.0  --   --   < > = values in this interval not displayed. ------------------------------------------------------------------------------------------------------------------  Cardiac Enzymes No results for input(s): TROPONINI in the last 168 hours. ------------------------------------------------------------------------------------------------------------------  RADIOLOGY:  Ct Abdomen Pelvis Wo Contrast  12/04/2015  CLINICAL DATA:  Bloating, decreased appetite, weakness. Black stools for 1 week. Diffuse abdominal pain. EXAM: CT ABDOMEN AND PELVIS WITHOUT CONTRAST TECHNIQUE: Multidetector CT imaging of the abdomen and pelvis was performed following the standard protocol without IV contrast. COMPARISON:  12/09/2010 FINDINGS: Adenopathy noted in the right cardiophrenic angle with lymph node measuring 2.3 cm on image 11. Small nodules noted  along the right hemidiaphragm, the largest on image 10 measures 11 mm. Small subpleural nodule in referral nodule adjacent to the diaphragm on the same image. Trace left pleural effusion. Probable scarring in the lingula. Heart  is borderline in size. There is diffuse omental and peritoneal nodularity. Diffuse mesenteric nodularity. Massive retroperitoneal adenopathy. Conglomerate nodal mass in the right periaortic space on image 42 measures 6.1 x 4.4 cm. There is a large conglomerate mass in the left abdomen and extending into the left pelvis. This measures 11.3 x 4.7 cm. This is intimately associated with the distal descending colon and proximal sigmoid colon. There is ascites noted around the liver and spleen and continued in the paracolic gutters into the pelvis. There is sigmoid diverticulosis. Stomach and small bowel grossly unremarkable. No definite focal abnormality in the liver, spleen, pancreas, right adrenal. Low-density  has small low-density areas in the lower poles of the kidneys bilaterally most likely reflects cysts but cannot be fully characterized without intravenous contrast. There is a left adrenal nodule measuring 2.8 cm. Urinary bladder is grossly unremarkable. Prostate is mildly enlarged. Prior left hip replacement. No acute bony abnormality or focal lesion. Degenerative changes in the lumbar spine. IMPRESSION: Markedly abnormal study with extensive peritoneal/ omental/ mesenteric nodularity. Bulky retroperitoneal adenopathy with a large conglomerate mass in the left lower quadrant intimately associated with the descending colon and proximal sigmoid colon. Differential considerations would include metastatic disease (possible colon primary? ) with peritoneal spread or lymphoma. Associated moderate ascites. Right cardiophrenic adenopathy. Nodularity in the right lung base could reflect metastatic disease. Trace left pleural effusion. Left adrenal nodule concerning for metastatic disease. Electronically Signed   By: Rolm Baptise M.D.   On: 12/04/2015 14:01     ASSESSMENT AND PLAN:     75 year old male with a history of proximal atrial fibrillation, colon polyps and essential hypertension who presents with weight  loss and poor appetite. He was found to have acute renal failure and hypercalcemia.  1. Acute renal failure with hypercalcemia:  is likely secondary to prerenal azotemia due to poor by mouth intake with nausea and vomiting. Creatinine and calcium have improved.  -Came in with creatinine of 2.17---1.69--1.73 -Calcium down to 11.9  -Continue IV fluids. Continue to monitor creatinine and calcium in a.m.  -Hypercalcemia could also be caused from possible lymphoma. The fact that is responding to IV fluids is reassuring.  2. Bulky retroperitoneal adenopathy with a large conglomerate mass in the left lower quadrant intimately associated with the descending colon and proximal sigmoid colon -Patient underwent colonoscopy 12/06/2015. No COLON MASS noted on colonoscopy -Patient had a colonoscopy about a year ago with no mention of any colon cancer. It is likely that this could be lymphoma. Appreciate oncology consultation.  Patienscheduled fored biopsy of the mass in the left lower quadrant today  - Once the biopsy and diagnosis obtained, oncology will better be able to assist the patient. - when necessary Dilaudid for abdominal discomfort   3. Essential hypertension: Blood pressure is adequately controlled on diltiazem. - Due to renal failure lisinopril and HCTZ have been discontinued.  -As creatinine improves, patient may be able to restart this medication.  4. BPH: Continue finasteride and tamsulosin.  5. History of paroxysmal atrial fibrillation: Continue diltiazem. His cardiologist is Dr. [name]. He is not on anticoagulation. At this time I would not start anticoagulation due to the issues above and need for possible biopsy.   results of colonoscopy, need for CT guided biopsy and further workup was  discussed with patient, wife and daughter in the room   Management plans discussed with the patient and he is in agreement.  CODE STATUS: Full  TOTAL TIME TAKING CARE OF THIS PATIENT: 30 minutes.   Physical therapy recommends skilled nursing facility, social worker has spoken with the family.   POSSIBLE D/C in 1-2 days days, DEPENDING ON CLINICAL CONDITION.   Cylah Fannin M.D on 12/07/2015 at 9:05 AM  Between 7am to 6pm - Pager - [phone] After 6pm go to [email or web address] - password [email or web address] [facility]  Office  [phone]  CC: Primary care physician; Lelon Huh, MD  Note: This dictation was prepared with Dragon dictation along with smaller phrase technology. Any transcriptional errors that result from this process are unintentional.

## 2015-12-07 NOTE — Progress Notes (Signed)
Clinical Social Worker (CSW) is following patient for SNF placement. Patient and family prefer for patient to go home depending on medical treatment plan. CSW will continue to follow and assist as needed.   Blima Rich, LCSW [phone]

## 2015-12-07 NOTE — Progress Notes (Signed)
PT Cancellation Note  Patient Details Name: Bob Christensen MRN: [id number] DOB: Dec 05, 1940   Cancelled Treatment:    Reason Eval/Treat Not Completed: Patient at procedure or test/unavailable. Pt currently at biopsy, unavailable for therapy at this time.   Winifred Bodiford 12/07/2015, 10:35 AM Greggory Stallion, PT, DPT [phone]

## 2015-12-07 NOTE — Procedures (Signed)
S/p LLQ abdominal nodal mass 18 g core bx with CT guidance No comp Stable Path pending Full report in PACS

## 2015-12-08 MED ORDER — TRAMADOL HCL 50 MG PO TABS: 50.0000 mg | ORAL_TABLET | Freq: Four times a day (QID) | ORAL | Status: AC | PRN

## 2015-12-08 MED ORDER — HYDROMORPHONE HCL 2 MG PO TABS: 2.0000 mg | ORAL_TABLET | Freq: Four times a day (QID) | ORAL | Status: AC | PRN

## 2015-12-08 MED ORDER — ENSURE ENLIVE PO LIQD: 237.0000 mL | Freq: Two times a day (BID) | ORAL | Status: DC

## 2015-12-08 MED ORDER — MEGESTROL ACETATE 400 MG/10ML PO SUSP: 400.0000 mg | Freq: Every day | ORAL | Status: AC

## 2015-12-08 MED ORDER — DOCUSATE SODIUM 100 MG PO CAPS: 100.0000 mg | ORAL_CAPSULE | Freq: Two times a day (BID) | ORAL | Status: AC

## 2015-12-08 MED ORDER — ENSURE ENLIVE PO LIQD: 237.0000 mL | Freq: Two times a day (BID) | ORAL | Status: AC

## 2015-12-08 NOTE — NC FL2 (Signed)
[place] LEVEL OF CARE SCREENING TOOL     IDENTIFICATION  Patient Name: Bob Christensen Birthdate: December 11, 1940 Sex: male Admission Date (Current Location): 12/04/2015  [place] and [place] Number:  Engineering geologist and Address:  [facility], [address], [place], [place] [address]      Provider Number: [id number]  Attending Physician Name and Address:  Fritzi Mandes, MD  Relative Name and Phone Number:       Current Level of Care: Hospital Recommended Level of Care: [place] Prior Approval Number:    Date Approved/Denied:   PASRR Number:  ([id number])  Discharge Plan: SNF    Current Diagnoses: Patient Active Problem List   Diagnosis Date Noted  . Abnormal findings-gastrointestinal tract   . Hypercalcemia 12/04/2015  . S/P total knee replacement 09/27/2015  . PAF (paroxysmal atrial fibrillation) ([place])   . Arthritis of knee, degenerative 06/06/2015  . Carpal tunnel syndrome on both sides 05/31/2015  . Pre-ulcerative calluses 05/26/2015  . Overweight 05/26/2015  . Numbness of hand 05/26/2015  . History of kidney stones 05/26/2015  . GI (gastrointestinal bleed) 05/26/2015  . Back pain with radiation 05/26/2015  . Arthralgia 05/26/2015  . Anemia 05/26/2015  . Fatigue 06/02/2013  . Elevated prostate specific antigen (PSA) 09/11/2012  . Chronic pain disorder 07/27/2012  . Numbness in feet 07/30/2011  . Diabetes mellitus with nephropathy ([place]) 04/06/2010  . Hyperlipidemia, mixed 04/06/2010  . Hypertension, essential, benign 04/06/2010  . Abnormal prostate specific antigen 03/06/2010  . GERD (gastroesophageal reflux disease) 08/03/2009  . Benign prostatic hypertrophy without urinary obstruction 11/12/2007  . Gout 05/01/2006  . History of atrial fibrillation 11/12/1995    Orientation RESPIRATION BLADDER Height & Weight    Self, Time, Situation, Place  Normal Incontinent 5\' 7"  (170.2 cm) 170 lbs.   BEHAVIORAL SYMPTOMS/MOOD NEUROLOGICAL BOWEL NUTRITION STATUS   (none )  (none ) Continent Diet (Diet: Soft )  AMBULATORY STATUS COMMUNICATION OF NEEDS Skin   Extensive Assist Verbally Normal                       Personal Care Assistance Level of Assistance  Bathing, Feeding, Dressing Bathing Assistance: Limited assistance Feeding assistance: Independent Dressing Assistance: Limited assistance     Functional Limitations Info  Sight, Hearing, Speech Sight Info: Adequate Hearing Info: Adequate Speech Info: Adequate    SPECIAL CARE FACTORS FREQUENCY  PT (By licensed PT), OT (By licensed OT)     PT Frequency:  (5) OT Frequency:  (5)            Contractures      Additional Factors Info  Code Status, Allergies Code Status Info:  (Full Code. ) Allergies Info:  (Digoxin)           Current Medications (12/08/2015):  This is the current hospital active medication list Current Facility-Administered Medications  Medication Dose Route Frequency Provider Last Rate Last Dose  . 0.9 %  sodium chloride infusion   Intravenous Continuous Fritzi Mandes, MD 50 mL/hr at 12/08/15 0549    . acetaminophen (TYLENOL) suppository 650 mg  650 mg Rectal Q6H PRN Lenis Noon, [facility]       Or  . acetaminophen (TYLENOL) tablet 650 mg  650 mg Oral Q6H PRN Lenis Noon, RPH      . allopurinol (ZYLOPRIM) tablet 200 mg  200 mg Oral Weber Cooks, MD   200 mg at 12/08/15 0939  . atorvastatin (LIPITOR)  tablet 20 mg  20 mg Oral q morning - 10a Fritzi Mandes, MD   20 mg at 12/08/15 0939  . diltiazem (CARDIZEM) tablet 60 mg  60 mg Oral TID Fritzi Mandes, MD   60 mg at 12/08/15 0940  . docusate sodium (COLACE) capsule 100 mg  100 mg Oral BID Fritzi Mandes, MD   100 mg at 12/08/15 0940  . enoxaparin (LOVENOX) injection 40 mg  40 mg Subcutaneous Q24H Lenis Noon, RPH   40 mg at 12/07/15 2119  . feeding supplement (ENSURE ENLIVE) (ENSURE ENLIVE) liquid 237 mL  237 mL Oral BID BM Fritzi Mandes, MD      .  finasteride (PROSCAR) tablet 5 mg  5 mg Oral Daily Fritzi Mandes, MD   5 mg at 12/08/15 0939  . fluticasone (FLONASE) 50 MCG/ACT nasal spray 2 spray  2 spray Each Nare Daily Fritzi Mandes, MD   2 spray at 12/08/15 0940  . HYDROmorphone (DILAUDID) injection 1 mg  1 mg Intravenous Q4H PRN Fritzi Mandes, MD   1 mg at 12/08/15 0548  . omega-3 acid ethyl esters (LOVAZA) capsule 1 g  1 g Oral Daily Fritzi Mandes, MD   1 g at 12/08/15 0939  . ondansetron (ZOFRAN) tablet 4 mg  4 mg Oral Q6H PRN Fritzi Mandes, MD       Or  . ondansetron (ZOFRAN) injection 4 mg  4 mg Intravenous Q6H PRN Fritzi Mandes, MD   4 mg at 12/07/15 0104  . oxyCODONE-acetaminophen (PERCOCET/ROXICET) 5-325 MG per tablet 1 tablet  1 tablet Oral Q8H PRN Fritzi Mandes, MD   1 tablet at 12/08/15 0940  . pantoprazole (PROTONIX) EC tablet 40 mg  40 mg Oral Daily Fritzi Mandes, MD   40 mg at 12/08/15 0940  . senna-docusate (Senokot-S) tablet 1 tablet  1 tablet Oral QHS PRN Fritzi Mandes, MD      . tamsulosin (FLOMAX) capsule 0.4 mg  0.4 mg Oral q morning - 10a Fritzi Mandes, MD   0.4 mg at 12/08/15 0940  . traMADol (ULTRAM) tablet 50-100 mg  50-100 mg Oral Q4H PRN Lenis Noon, [facility]         Discharge Medications: Please see discharge summary for a list of discharge medications.  Relevant Imaging Results:  Relevant Lab Results:   Additional Information  (SSN: [id number])  Loralyn Freshwater, LCSW

## 2015-12-08 NOTE — Progress Notes (Signed)
Pt is refusing to go to rehab. Will d/c with HHPT,RN and SW

## 2015-12-08 NOTE — Care Management Important Message (Signed)
Important Message  Patient Details  Name: Bob Christensen MRN: [id number] Date of Birth: 10-18-1941   Medicare Important Message Given:  Yes    Marshell Garfinkel, RN 12/08/2015, 9:27 AM

## 2015-12-08 NOTE — Progress Notes (Signed)
Physical Therapy Treatment Patient Details Name: Bob Christensen MRN: [id number] DOB: Apr 11, 1941 Today's Date: 12/08/2015    History of Present Illness Pt admitted for acute renal failure. Pt with recent L TKR in Nov. 2016. Pt now with positive mass in L lower quadrant and colon with possible mets. Pt with complaints of weight loss, poor appetite, dizziness and bloating x 2 weeks. Pt with history of afib, CAD, HTN, and DJD. Pt confused at this time, only oriented to self.     PT Comments    Pt is making good progress towards goals, however still remains unsafe to dc home at this time. Pt impulsive and needs cues/supervision for OOB mobility. Pt still with slight L LE buckling and limp with cues for reciprocal gait pattern. Extensive conversation with pt with family in room along with CSW about benefits of SNF vs home regarding therapy. Pt with good endurance with there-ex.  Follow Up Recommendations  SNF     Equipment Recommendations       Recommendations for Other Services       Precautions / Restrictions Precautions Precautions: Fall Restrictions Weight Bearing Restrictions: No    Mobility  Bed Mobility               General bed mobility comments: not performed as pt seated at EOB  Transfers Overall transfer level: Needs assistance Equipment used: Rolling walker (2 wheeled) Transfers: Sit to/from Stand Sit to Stand: Min guard         General transfer comment: transfers performed with rw and cues for pushing from seated surface. Once standing, pt able to stand with sueprvision. Pt slightly impulsive and eneds cues to wait for therapist prior to performing mobility.  Ambulation/Gait Ambulation/Gait assistance: Min assist Ambulation Distance (Feet): 80 Feet Assistive device: Rolling walker (2 wheeled) Gait Pattern/deviations: Step-through pattern     General Gait Details: ambulated with slight L knee bluckling, however no formal LOB noted. Pt fatigues with  increased ambulation distance. Pt follows commands with good technique, however slightly impulsive and needs cues for safety.   Stairs            Wheelchair Mobility    Modified Rankin (Stroke Patients Only)       Balance                                    Cognition Arousal/Alertness: Awake/alert Behavior During Therapy: WFL for tasks assessed/performed Overall Cognitive Status: Impaired/Different from baseline                      Exercises Other Exercises Other Exercises: Seated ther-ex performed including B LE LAQ, alt. marching, LAQ with hip abd/add for sequencing, scap squeezes, and B UE resisted elbow flexion. All ther-ex performed x 12 reps with cga and cues for correct technique.    General Comments        Pertinent Vitals/Pain Pain Assessment: No/denies pain    Home Living                      Prior Function            PT Goals (current goals can now be found in the care plan section) Acute Rehab PT Goals Patient Stated Goal: to go home PT Goal Formulation: With patient Time For Goal Achievement: 12/19/15 Potential to Achieve Goals: Good Progress towards PT goals: Progressing toward goals  Frequency  Min 2X/week    PT Plan Current plan remains appropriate    Co-evaluation             End of Session Equipment Utilized During Treatment: Gait belt Activity Tolerance: Patient tolerated treatment well Patient left: in bed;with bed alarm set     Time: [id number] PT Time Calculation (min) (ACUTE ONLY): 25 min  Charges:  $Gait Training: 8-22 mins $Therapeutic Exercise: 8-22 mins                    G Codes:      Cindi Ghazarian 12/10/2015, 12:24 PM  Greggory Stallion, PT, DPT [phone]

## 2015-12-08 NOTE — Clinical Social Work Placement (Signed)
   CLINICAL SOCIAL WORK PLACEMENT  NOTE  Date:  12/08/2015  Patient Details  Name: Bob Christensen MRN: [id number] Date of Birth: 22-Nov-1940  Clinical Social Work is seeking post-discharge placement for this patient at the [place] level of care (*CSW will initial, date and re-position this form in  chart as items are completed):  Yes   Patient/family provided with [place] Work Department's list of facilities offering this level of care within the geographic area requested by the patient (or if unable, by the patient's family).  Yes   Patient/family informed of their freedom to choose among providers that offer the needed level of care, that participate in Medicare, Medicaid or managed care program needed by the patient, have an available bed and are willing to accept the patient.  Yes   Patient/family informed of [facility]'s ownership interest in [facility] and [facility], as well as of the fact that they are under no obligation to receive care at these facilities.  PASRR submitted to EDS on 12/05/15     PASRR number received on 12/05/15     Existing PASRR number confirmed on       FL2 transmitted to all facilities in geographic area requested by pt/family on 12/05/15     FL2 transmitted to all facilities within larger geographic area on       Patient informed that his/her managed care company has contracts with or will negotiate with certain facilities, including the following:        Yes   Patient/family informed of bed offers received.  Patient chooses bed at  [facility] )     Physician recommends and patient chooses bed at      Patient to be transferred to  [facility] ) on 12/08/15.  Patient to be transferred to facility by  [facility] )     Patient family notified on 12/08/15 of transfer.  Name of family member notified:   (Patient's son Alecsander, daughter and sister were at bedside and aware of D/C  today. )     PHYSICIAN       Additional Comment:    _______________________________________________ Loralyn Freshwater, LCSW 12/08/2015, 1:46 PM

## 2015-12-08 NOTE — Progress Notes (Signed)
Family changed their mind about EMS transporting the pt.  They are now wanting to transport the pt themselves. [employer] notified of this. Pt pushed out via wheelchair by me to waiting car

## 2015-12-08 NOTE — Discharge Instructions (Signed)
PT

## 2015-12-08 NOTE — Progress Notes (Signed)
MD wants to discharge to the pt to skilled.  Order placed

## 2015-12-08 NOTE — Progress Notes (Signed)
Discharge report called to Levada Dy at [facility]. Pt will transport via ems. Family at bedside.

## 2015-12-08 NOTE — Discharge Summary (Addendum)
[facility] [place] NAME: Kerman Breech    MR#:  [id number]  DATE OF BIRTH:  June 16, 1941  DATE OF ADMISSION:  12/04/2015 ADMITTING PHYSICIAN: Fritzi Mandes, MD  DATE OF DISCHARGE: 12/08/15  PRIMARY CARE PHYSICIAN: Lelon Huh, MD    ADMISSION DIAGNOSIS:  Hypercalcemia [E83.52] Abdominal mass [R19.00] Weakness [R53.1] Generalized abdominal pain [R10.84]  DISCHARGE DIAGNOSIS:  Large mass in the left lower abdomen with bulky lymphadenopathy status post CT-guided biopsy results pending Large volume paracentesis due to ascites Generalized weakness Hypercalcemia DJD SECONDARY DIAGNOSIS:   Past Medical History  Diagnosis Date  . History of colon polyps 2012    a. Tubular adenoma excised by Tiffany Kocher  . History of kidney stones   . Osteoarthritis   . Brachial neuritis   . GI bleed     a. Following polypectomy  . Biceps tendon rupture 2015  . Malignant melanoma of skin of scalp ([place]) 2011    a. Excised 2001 [facility] [place] Thereasa Solo)  . PAF (paroxysmal atrial fibrillation) ([place])     a. 1997 - no known recurrence since;  b. CHA2DS2VASc = 4.  . Carotid arterial disease ([place])     a. 07/2014 Carotid U/S: bilat 0-39% stenosis.  . Degenerative joint disease     a. 2010 s/p L THA;  b. 2016 - pending L knee arthroplasty.  . H/O cardiac catheterization     a. 1997 - reportedly normal.  . Essential hypertension   . Diet-controlled diabetes mellitus ([place])   . Diabetes mellitus without complication ([place])   . Colonic mass     HOSPITAL COURSE:  75 year old male with a history of proximal atrial fibrillation, colon polyps and essential hypertension who presents with weight loss and poor appetite. He was found to have acute renal failure and hypercalcemia.  1. Acute renal failure with hypercalcemia: is likely secondary to prerenal azotemia due to poor by mouth intake with nausea and vomiting. Creatinine and calcium have improved.   -Came in with creatinine of 2.17---1.69--1.73 -Calcium down to 11.9  -Patient received IV fluids through the hospital stay  -Hypercalcemia could also be caused from possible lymphoma.   2. Bulky retroperitoneal adenopathy with a large conglomerate mass in the left lower quadrant intimately associated with the descending colon and proximal sigmoid colon -Patient underwent colonoscopy 12/06/2015. No COLON MASS noted on colonoscopy -Patient had a colonoscopy about a year ago with no mention of any colon cancer. It is likely that this could be lymphoma. Appreciate oncology consultation recommends okay for discharge from oncology standpoint. Outpatient follow-up - when necessary Dilaudid for abdominal discomfort   3. Essential hypertension: Blood pressure is adequately controlled on diltiazem. - Due to renal failure lisinopril and HCTZ have been discontinued.  -As creatinine improves, patient may be able to restart this medication.  4. BPH: Continue finasteride and tamsulosin.  5. History of paroxysmal atrial fibrillation: Continue diltiazem. His cardiologist is Dr. [name]. He is not on anticoagulation. At this time I would not start anticoagulation due to the issues above and need for possible biopsy.  6. Ascites Patient underwent large-volume paracentesis on 12/07/2015  3 L removed Fluid cytology pending Differential cell count favors lymphocytes  7. Generalized weakness poor by mouth intake suspected due to abdominal mass -Ensure twice a day -Start patient on Megace -Physical therapy recommends rehabilitation. We'll discuss with patient for rehabilitation and see if he gets convinced to go. Family is agreeable for rehabilitation, spoke with daughter at length.  CONSULTS OBTAINED:  Treatment Team:  Lloyd Huger, MD Lucilla Lame, MD  DRUG ALLERGIES:   Allergies  Allergen Reactions  . Digoxin Other (See Comments)    "Heart stop.  Shocked my heart"    DISCHARGE  MEDICATIONS:   Current Discharge Medication List    START taking these medications   Details  docusate sodium (COLACE) 100 MG capsule Take 1 capsule (100 mg total) by mouth 2 (two) times daily. Qty: 10 capsule, Refills: 0    feeding supplement, ENSURE ENLIVE, (ENSURE ENLIVE) LIQD Take 237 mLs by mouth 2 (two) times daily between meals. Qty: 237 mL, Refills: 12    HYDROmorphone (DILAUDID) 2 MG tablet Take 1 tablet (2 mg total) by mouth every 6 (six) hours as needed for severe pain. Qty: 30 tablet, Refills: 0    megestrol (MEGACE) 400 MG/10ML suspension Take 10 mLs (400 mg total) by mouth daily. Qty: 240 mL, Refills: 0      CONTINUE these medications which have CHANGED   Details  traMADol (ULTRAM) 50 MG tablet Take 1 tablet (50 mg total) by mouth every 6 (six) hours as needed for moderate pain (mild to moderate pain). Qty: 40 tablet, Refills: 1      CONTINUE these medications which have NOT CHANGED   Details  allopurinol (ZYLOPRIM) 100 MG tablet Take 200 mg by mouth every morning.     aspirin 81 MG EC tablet Take 81 mg by mouth daily.      atorvastatin (LIPITOR) 20 MG tablet Take 20 mg by mouth every morning.     diltiazem (CARDIZEM) 60 MG tablet Take 1 tablet (60 mg total) by mouth 3 (three) times daily. Qty: 270 tablet, Refills: 3    finasteride (PROSCAR) 5 MG tablet Take 5 mg by mouth daily.      fluticasone (FLONASE) 50 MCG/ACT nasal spray Place 2 sprays into both nostrils daily. Qty: 16 g, Refills: 6   Associated Diagnoses: Post-nasal discharge    gabapentin (NEURONTIN) 600 MG tablet Take 600 mg by mouth 4 (four) times daily.    GLUCOSAMINE-CHONDROITIN-VIT C PO Take 1 capsule by mouth daily.      lansoprazole (PREVACID 24HR) 15 MG capsule Take 15 mg by mouth daily.    Multiple Vitamins-Minerals (CENTRUM SILVER PO) Take 1 tablet by mouth daily.      Omega 3 340 MG CPDR Take 340 mg by mouth daily.    Tamsulosin HCl (FLOMAX) 0.4 MG CAPS Take 0.4 mg by mouth every  morning.       STOP taking these medications     lisinopril-hydrochlorothiazide (PRINZIDE,ZESTORETIC) 10-12.5 MG tablet      oxyCODONE-acetaminophen (ROXICET) 5-325 MG tablet      enoxaparin (LOVENOX) 30 MG/0.3ML injection         If you experience worsening of your admission symptoms, develop shortness of breath, life threatening emergency, suicidal or homicidal thoughts you must seek medical attention immediately by calling 911 or calling your MD immediately  if symptoms less severe.  You Must read complete instructions/literature along with all the possible adverse reactions/side effects for all the Medicines you take and that have been prescribed to you. Take any new Medicines after you have completely understood and accept all the possible adverse reactions/side effects.   Please note  You were cared for by a hospitalist during your hospital stay. If you have any questions about your discharge medications or the care you received while you were in the hospital after you are discharged, you can  call the unit and asked to speak with the hospitalist on call if the hospitalist that took care of you is not available. Once you are discharged, your primary care physician will handle any further medical issues. Please note that NO REFILLS for any discharge medications will be authorized once you are discharged, as it is imperative that you return to your primary care physician (or establish a relationship with a primary care physician if you do not have one) for your aftercare needs so that they can reassess your need for medications and monitor your lab values. Today   SUBJECTIVE   Patient tearful this morning since not able to eat much. Abdominal discomfort little better after fluid removal. family in the room  VITAL SIGNS:  Blood pressure 122/54, pulse 67, temperature 98.1 F (36.7 C), temperature source Oral, resp. rate 18, height 5\' 7"  (1.702 m), weight 77.111 kg (170 lb), SpO2 95  %.  I/O:    Intake/Output Summary (Last 24 hours) at 12/08/15 1205 Last data filed at 12/08/15 0400  Gross per 24 hour  Intake    800 ml  Output    150 ml  Net    650 ml    PHYSICAL EXAMINATION:  GENERAL:  75 y.o.-year-old patient lying in the bed with no acute distress.  EYES: Pupils equal, round, reactive to light and accommodation. No scleral icterus. Extraocular muscles intact.  HEENT: Head atraumatic, normocephalic. Oropharynx and nasopharynx clear.  NECK:  Supple, no jugular venous distention. No thyroid enlargement, no tenderness.  LUNGS: Normal breath sounds bilaterally, no wheezing, rales,rhonchi or crepitation. No use of accessory muscles of respiration.  CARDIOVASCULAR: S1, S2 normal. No murmurs, rubs, or gallops.  ABDOMEN: Soft, non-tender, distended. Bowel sounds present. No organomegaly or mass.  EXTREMITIES: No pedal edema, cyanosis, or clubbing.  NEUROLOGIC: Cranial nerves II through XII are intact. Muscle strength 4 /5 in all extremities. Sensation intact. Gait not checked.  PSYCHIATRIC: The patient is alert and oriented x 3.  SKIN: No obvious rash, lesion, or ulcer.   DATA REVIEW:   CBC   Recent Labs Lab 12/06/15 0600  WBC 8.9  HGB 9.6*  HCT 30.3*  PLT 269    Chemistries   Recent Labs Lab 12/04/15 0946  12/07/15 0443  NA 141  < > 146*  K 5.0  < > 4.3  CL 104  < > 114*  CO2 26  < > 24  GLUCOSE 94  < > 77  BUN 72*  < > 55*  CREATININE 2.17*  < > 1.73*  CALCIUM 14.0*  < > 11.9*  AST 27  --   --   ALT 18  --   --   ALKPHOS 73  --   --   BILITOT 1.0  --   --   < > = values in this interval not displayed.  Microbiology Results   No results found for this or any previous visit (from the past 240 hour(s)).  RADIOLOGY:  US Paracentesis  12/08/2015  CLINICAL DATA:  Abdominal distention and discomfort, peritoneal carcinomatosis, suspect malignant ascites EXAM: ULTRASOUND GUIDED PARACENTESIS COMPARISON:  12/07/2015 PROCEDURE: An ultrasound guided  paracentesis was thoroughly discussed with the patient and questions answered. The benefits, risks, alternatives and complications were also discussed. The patient understands and wishes to proceed with the procedure. Written consent was obtained. Ultrasound was performed to localize and mark an adequate pocket of fluid in the right lower quadrant of the abdomen. The area was then prepped and  draped in the normal sterile fashion. 1% Lidocaine was used for local anesthesia. Under ultrasound guidance a 19 gauge Yueh catheter was introduced. Paracentesis was performed. The catheter was removed and a dressing applied. COMPLICATIONS: None immediate FINDINGS: A total of approximately 3 L of cloudy peritoneal fluid was removed. A fluid sample was sent for laboratory analysis. IMPRESSION: Successful ultrasound guided paracentesis yielding 3 L of ascites. Electronically Signed   By: Jerilynn Mages.  Shick M.D.   On: 12/08/2015 08:14   Ct Biopsy  12/07/2015  CLINICAL DATA:  Abdominal and retroperitoneal adenopathy, abdominal ascites and peritoneal carcinomatosis EXAM: CT-GUIDED BIOPSY MEDICATIONS AND MEDICAL HISTORY: Versed 1 mg, Fentanyl 50 mcg. Additional Medications: 1% lidocaine locally. ANESTHESIA/SEDATION: Moderate sedation time: 15 minutes, the patient's level of consciousness and physiological status was monitored by radiology nursing. PROCEDURE: The procedure, risks, benefits, and alternatives were explained to the patient. Questions regarding the procedure were encouraged and answered. The patient understands and consents to the procedure. The left lower quadrant was prepped with ChloraPrep in a sterile fashion, and a sterile drape was applied covering the operative field. A sterile gown and sterile gloves were used for the procedure. Patient was positioned supine. Noncontrast localization CT performed. The left lower quadrant abdominal nodal mass was localized. Under CT guidance, a(n) 17 gauge guide needle was advanced into  the left lower quadrant abdominal nodal mass. Needle position confirmed with CT. 3 18 gauge core biopsies obtained. The guide needle was removed. Final imaging was performed. Patient tolerated the procedure well without complication. Vital sign monitoring by nursing staff during the procedure will continue as patient is in the special procedures unit for post procedure observation. FINDINGS: The images document guide needle placement within the left lower quadrant abdominal nodal mass. Post biopsy images demonstrate no hemorrhage or hematoma. COMPLICATIONS: None immediate IMPRESSION: Successful CT-guided core biopsy of the left lower quadrant abdominal nodal mass. Electronically Signed   By: Jerilynn Mages.  Shick M.D.   On: 12/07/2015 11:33     Management plans discussed with the patient, family and they are in agreement.  CODE STATUS:     Code Status Orders        Start     Ordered   12/04/15 1843  Full code   Continuous     12/04/15 1842    Code Status History    Date Active Date Inactive Code Status Order ID Comments User Context   09/27/2015 12:30 PM 09/30/2015  2:47 PM Full Code [id number]  Leanor Kail, MD Inpatient      TOTAL TIME TAKING CARE OF THIS PATIENT: 40 minutes.    Novelle Addair M.D on 12/08/2015 at 12:05 PM  Between 7am to 6pm - Pager - [phone] After 6pm go to [email or web address] - password [email or web address] [facility]  Office  [phone]  CC: Primary care physician; Lelon Huh, MD

## 2015-12-08 NOTE — Progress Notes (Signed)
Clinical Education officer, museum (CSW) met with patient and his family (son, daughter, sister) to discuss D/C plan. Patient is agreeable to SNF. CSW presented bed offers. Patient chose [facility].   Patient is medically stable for D/C to [facility] today. Per [facility] admissions coordinator at [facility] patient is going to room 408. RN will call report to 400 hall and arrange EMS for transport. CSW sent D/C Summary, FL2 and D/C Packet to [facility] via [place]. Patient has a follow up appointment with [facility] Oncologist. Patient, family and [place] are aware of follow up appointment. Please reconsult if future social work needs arise. CSW signing off.   Blima Rich, LCSW [phone]

## 2015-12-12 ENCOUNTER — Inpatient Hospital Stay
Admission: AD | Admit: 2015-12-12 | Discharge: 2016-01-10 | DRG: 871 | Disposition: E | Payer: Medicare Other | Source: Ambulatory Visit | Attending: Internal Medicine | Admitting: Internal Medicine

## 2015-12-12 ENCOUNTER — Encounter: Payer: Self-pay | Admitting: Oncology

## 2015-12-12 ENCOUNTER — Inpatient Hospital Stay: Payer: Medicare Other | Attending: Oncology | Admitting: Oncology

## 2015-12-12 VITALS — BP 169/71 | HR 90 | Temp 98.9°F | Resp 16 | Ht 66.54 in | Wt 172.0 lb

## 2015-12-12 DIAGNOSIS — G9341 Metabolic encephalopathy: Secondary | ICD-10-CM | POA: Diagnosis present

## 2015-12-12 DIAGNOSIS — R600 Localized edema: Secondary | ICD-10-CM | POA: Diagnosis present

## 2015-12-12 DIAGNOSIS — E785 Hyperlipidemia, unspecified: Secondary | ICD-10-CM | POA: Diagnosis present

## 2015-12-12 DIAGNOSIS — E119 Type 2 diabetes mellitus without complications: Secondary | ICD-10-CM | POA: Diagnosis present

## 2015-12-12 DIAGNOSIS — C801 Malignant (primary) neoplasm, unspecified: Secondary | ICD-10-CM

## 2015-12-12 DIAGNOSIS — I2510 Atherosclerotic heart disease of native coronary artery without angina pectoris: Secondary | ICD-10-CM

## 2015-12-12 DIAGNOSIS — I959 Hypotension, unspecified: Secondary | ICD-10-CM | POA: Diagnosis not present

## 2015-12-12 DIAGNOSIS — Z96642 Presence of left artificial hip joint: Secondary | ICD-10-CM | POA: Diagnosis present

## 2015-12-12 DIAGNOSIS — E860 Dehydration: Secondary | ICD-10-CM | POA: Diagnosis present

## 2015-12-12 DIAGNOSIS — R410 Disorientation, unspecified: Secondary | ICD-10-CM

## 2015-12-12 DIAGNOSIS — Z7982 Long term (current) use of aspirin: Secondary | ICD-10-CM

## 2015-12-12 DIAGNOSIS — R188 Other ascites: Secondary | ICD-10-CM | POA: Diagnosis present

## 2015-12-12 DIAGNOSIS — C786 Secondary malignant neoplasm of retroperitoneum and peritoneum: Secondary | ICD-10-CM | POA: Diagnosis present

## 2015-12-12 DIAGNOSIS — R1900 Intra-abdominal and pelvic swelling, mass and lump, unspecified site: Secondary | ICD-10-CM | POA: Insufficient documentation

## 2015-12-12 DIAGNOSIS — J9601 Acute respiratory failure with hypoxia: Secondary | ICD-10-CM | POA: Diagnosis present

## 2015-12-12 DIAGNOSIS — M792 Neuralgia and neuritis, unspecified: Secondary | ICD-10-CM

## 2015-12-12 DIAGNOSIS — I480 Paroxysmal atrial fibrillation: Secondary | ICD-10-CM | POA: Diagnosis present

## 2015-12-12 DIAGNOSIS — Z8719 Personal history of other diseases of the digestive system: Secondary | ICD-10-CM

## 2015-12-12 DIAGNOSIS — Z86010 Personal history of colonic polyps: Secondary | ICD-10-CM | POA: Insufficient documentation

## 2015-12-12 DIAGNOSIS — Z4659 Encounter for fitting and adjustment of other gastrointestinal appliance and device: Secondary | ICD-10-CM

## 2015-12-12 DIAGNOSIS — E872 Acidosis: Secondary | ICD-10-CM | POA: Diagnosis present

## 2015-12-12 DIAGNOSIS — N170 Acute kidney failure with tubular necrosis: Secondary | ICD-10-CM | POA: Diagnosis present

## 2015-12-12 DIAGNOSIS — Z79899 Other long term (current) drug therapy: Secondary | ICD-10-CM

## 2015-12-12 DIAGNOSIS — J9690 Respiratory failure, unspecified, unspecified whether with hypoxia or hypercapnia: Secondary | ICD-10-CM

## 2015-12-12 DIAGNOSIS — C969 Malignant neoplasm of lymphoid, hematopoietic and related tissue, unspecified: Secondary | ICD-10-CM

## 2015-12-12 DIAGNOSIS — R579 Shock, unspecified: Secondary | ICD-10-CM | POA: Diagnosis not present

## 2015-12-12 DIAGNOSIS — C8590 Non-Hodgkin lymphoma, unspecified, unspecified site: Secondary | ICD-10-CM | POA: Diagnosis present

## 2015-12-12 DIAGNOSIS — R180 Malignant ascites: Secondary | ICD-10-CM | POA: Diagnosis present

## 2015-12-12 DIAGNOSIS — Z79818 Long term (current) use of other agents affecting estrogen receptors and estrogen levels: Secondary | ICD-10-CM | POA: Diagnosis not present

## 2015-12-12 DIAGNOSIS — Z8052 Family history of malignant neoplasm of bladder: Secondary | ICD-10-CM

## 2015-12-12 DIAGNOSIS — Z515 Encounter for palliative care: Secondary | ICD-10-CM | POA: Diagnosis not present

## 2015-12-12 DIAGNOSIS — Z87442 Personal history of urinary calculi: Secondary | ICD-10-CM | POA: Insufficient documentation

## 2015-12-12 DIAGNOSIS — I6529 Occlusion and stenosis of unspecified carotid artery: Secondary | ICD-10-CM | POA: Diagnosis present

## 2015-12-12 DIAGNOSIS — R0600 Dyspnea, unspecified: Secondary | ICD-10-CM

## 2015-12-12 DIAGNOSIS — N289 Disorder of kidney and ureter, unspecified: Secondary | ICD-10-CM | POA: Insufficient documentation

## 2015-12-12 DIAGNOSIS — M1990 Unspecified osteoarthritis, unspecified site: Secondary | ICD-10-CM | POA: Insufficient documentation

## 2015-12-12 DIAGNOSIS — R509 Fever, unspecified: Secondary | ICD-10-CM

## 2015-12-12 DIAGNOSIS — Z85828 Personal history of other malignant neoplasm of skin: Secondary | ICD-10-CM

## 2015-12-12 DIAGNOSIS — G629 Polyneuropathy, unspecified: Secondary | ICD-10-CM | POA: Diagnosis present

## 2015-12-12 DIAGNOSIS — J189 Pneumonia, unspecified organism: Secondary | ICD-10-CM | POA: Diagnosis present

## 2015-12-12 DIAGNOSIS — Z66 Do not resuscitate: Secondary | ICD-10-CM | POA: Diagnosis present

## 2015-12-12 DIAGNOSIS — C8583 Other specified types of non-Hodgkin lymphoma, intra-abdominal lymph nodes: Secondary | ICD-10-CM

## 2015-12-12 DIAGNOSIS — I10 Essential (primary) hypertension: Secondary | ICD-10-CM | POA: Diagnosis present

## 2015-12-12 DIAGNOSIS — R6521 Severe sepsis with septic shock: Secondary | ICD-10-CM | POA: Diagnosis present

## 2015-12-12 DIAGNOSIS — Z96652 Presence of left artificial knee joint: Secondary | ICD-10-CM | POA: Diagnosis present

## 2015-12-12 DIAGNOSIS — Z87891 Personal history of nicotine dependence: Secondary | ICD-10-CM | POA: Insufficient documentation

## 2015-12-12 DIAGNOSIS — N400 Enlarged prostate without lower urinary tract symptoms: Secondary | ICD-10-CM | POA: Diagnosis present

## 2015-12-12 DIAGNOSIS — R109 Unspecified abdominal pain: Secondary | ICD-10-CM | POA: Insufficient documentation

## 2015-12-12 DIAGNOSIS — Z85820 Personal history of malignant melanoma of skin: Secondary | ICD-10-CM | POA: Insufficient documentation

## 2015-12-12 DIAGNOSIS — A4151 Sepsis due to Escherichia coli [E. coli]: Principal | ICD-10-CM | POA: Diagnosis present

## 2015-12-12 DIAGNOSIS — R609 Edema, unspecified: Secondary | ICD-10-CM

## 2015-12-12 DIAGNOSIS — M5412 Radiculopathy, cervical region: Secondary | ICD-10-CM | POA: Insufficient documentation

## 2015-12-12 DIAGNOSIS — R5383 Other fatigue: Secondary | ICD-10-CM | POA: Insufficient documentation

## 2015-12-12 LAB — CBC
HEMATOCRIT: 30.2 % — AB (ref 40.0–52.0)
Hemoglobin: 9.8 g/dL — ABNORMAL LOW (ref 13.0–18.0)
MCH: 28.2 pg (ref 26.0–34.0)
MCHC: 32.3 g/dL (ref 32.0–36.0)
MCV: 87.3 fL (ref 80.0–100.0)
Platelets: 223 10*3/uL (ref 150–440)
RBC: 3.46 MIL/uL — ABNORMAL LOW (ref 4.40–5.90)
RDW: 17.1 % — AB (ref 11.5–14.5)
WBC: 9.4 10*3/uL (ref 3.8–10.6)

## 2015-12-12 MED ORDER — DILTIAZEM HCL 60 MG PO TABS
60.0000 mg | ORAL_TABLET | Freq: Three times a day (TID) | ORAL | Status: DC
  Administered 2015-12-12: 22:00:00 60 mg via ORAL
  Filled 2015-12-12: qty 1

## 2015-12-12 MED ORDER — FINASTERIDE 5 MG PO TABS
5.0000 mg | ORAL_TABLET | Freq: Every day | ORAL | Status: DC
Start: 2015-12-12 — End: 2015-12-13

## 2015-12-12 MED ORDER — MEGESTROL ACETATE 400 MG/10ML PO SUSP
400.0000 mg | Freq: Every day | ORAL | Status: DC
  Filled 2015-12-12: qty 10

## 2015-12-12 MED ORDER — DOCUSATE SODIUM 100 MG PO CAPS
100.0000 mg | ORAL_CAPSULE | Freq: Two times a day (BID) | ORAL | Status: DC
  Administered 2015-12-12: 22:00:00 100 mg via ORAL
  Filled 2015-12-12: qty 1

## 2015-12-12 MED ORDER — ENOXAPARIN SODIUM 40 MG/0.4ML ~~LOC~~ SOLN
40.0000 mg | SUBCUTANEOUS | Status: DC
  Administered 2015-12-12 – 2015-12-13 (×2): 40 mg via SUBCUTANEOUS
  Filled 2015-12-12 (×2): qty 0.4

## 2015-12-12 MED ORDER — ACETAMINOPHEN 650 MG RE SUPP
650.0000 mg | Freq: Four times a day (QID) | RECTAL | Status: DC | PRN
  Administered 2015-12-13 – 2015-12-14 (×2): 650 mg via RECTAL
  Filled 2015-12-12 (×2): qty 1

## 2015-12-12 MED ORDER — ATORVASTATIN CALCIUM 20 MG PO TABS: 20.0000 mg | ORAL_TABLET | Freq: Every morning | ORAL | Status: DC

## 2015-12-12 MED ORDER — ASPIRIN 81 MG PO TBEC: 81.0000 mg | DELAYED_RELEASE_TABLET | Freq: Every day | ORAL | Status: DC

## 2015-12-12 MED ORDER — ZOLEDRONIC ACID 4 MG/5ML IV CONC
4.0000 mg | Freq: Once | INTRAVENOUS | Status: AC
  Administered 2015-12-12: 22:00:00 4 mg via INTRAVENOUS
  Filled 2015-12-12: qty 5

## 2015-12-12 MED ORDER — ADULT MULTIVITAMIN W/MINERALS CH
1.0000 | ORAL_TABLET | Freq: Every day | ORAL | Status: DC
  Filled 2015-12-12: qty 1

## 2015-12-12 MED ORDER — ONDANSETRON HCL 4 MG PO TABS: 4.0000 mg | ORAL_TABLET | Freq: Four times a day (QID) | ORAL | Status: DC | PRN

## 2015-12-12 MED ORDER — ALLOPURINOL 100 MG PO TABS
200.0000 mg | ORAL_TABLET | ORAL | Status: DC
  Administered 2015-12-14: 200 mg via ORAL
  Filled 2015-12-12: qty 2

## 2015-12-12 MED ORDER — TRAMADOL HCL 50 MG PO TABS: 50.0000 mg | ORAL_TABLET | Freq: Four times a day (QID) | ORAL | Status: DC | PRN

## 2015-12-12 MED ORDER — TAMSULOSIN HCL 0.4 MG PO CAPS: 0.4000 mg | ORAL_CAPSULE | Freq: Every morning | ORAL | Status: DC

## 2015-12-12 MED ORDER — ACETAMINOPHEN 325 MG PO TABS
650.0000 mg | ORAL_TABLET | Freq: Four times a day (QID) | ORAL | Status: DC | PRN
  Administered 2015-12-14: 650 mg via ORAL
  Filled 2015-12-12: qty 2

## 2015-12-12 MED ORDER — FLUTICASONE PROPIONATE 50 MCG/ACT NA SUSP
2.0000 | Freq: Every day | NASAL | Status: DC
  Administered 2015-12-12: 22:00:00 2 via NASAL
  Filled 2015-12-12: qty 16

## 2015-12-12 MED ORDER — FUROSEMIDE 20 MG PO TABS: 20.0000 mg | ORAL_TABLET | Freq: Every day | ORAL | Status: DC

## 2015-12-12 MED ORDER — SODIUM CHLORIDE 0.9% FLUSH
3.0000 mL | Freq: Two times a day (BID) | INTRAVENOUS | Status: DC
  Administered 2015-12-12 – 2015-12-14 (×2): 3 mL via INTRAVENOUS

## 2015-12-12 MED ORDER — GABAPENTIN 600 MG PO TABS
600.0000 mg | ORAL_TABLET | Freq: Four times a day (QID) | ORAL | Status: DC
  Administered 2015-12-12: 22:00:00 600 mg via ORAL
  Filled 2015-12-12 (×4): qty 1

## 2015-12-12 MED ORDER — SODIUM CHLORIDE 0.9 % IV SOLN
INTRAVENOUS | Status: DC
  Administered 2015-12-12 – 2015-12-14 (×5): via INTRAVENOUS

## 2015-12-12 MED ORDER — HYDROCODONE-ACETAMINOPHEN 5-325 MG PO TABS: 1.0000 | ORAL_TABLET | ORAL | Status: DC | PRN

## 2015-12-12 MED ORDER — ONDANSETRON HCL 4 MG/2ML IJ SOLN: 4.0000 mg | Freq: Four times a day (QID) | INTRAMUSCULAR | Status: DC | PRN

## 2015-12-12 MED ORDER — HYDROMORPHONE HCL 1 MG/ML IJ SOLN
2.0000 mg | INTRAMUSCULAR | Status: DC | PRN
  Administered 2015-12-13: 06:00:00 2 mg via INTRAVENOUS
  Filled 2015-12-12: qty 2

## 2015-12-12 MED ORDER — ENSURE ENLIVE PO LIQD: 237.0000 mL | Freq: Two times a day (BID) | ORAL | Status: DC

## 2015-12-12 MED ORDER — PANTOPRAZOLE SODIUM 40 MG PO TBEC
40.0000 mg | DELAYED_RELEASE_TABLET | Freq: Every day | ORAL | Status: DC
  Administered 2015-12-12: 22:00:00 40 mg via ORAL
  Filled 2015-12-12: qty 1

## 2015-12-12 MED ORDER — GLUCOSAMINE-CHONDROITIN-VIT C 2000-1200-60 MG/30ML PO LIQD: Freq: Every day | ORAL | Status: DC

## 2015-12-12 NOTE — Consult Note (Signed)
[facility]  Telephone:([phone]) [phone] Fax:(336) [phone]  ID: Bob Christensen OB: 1941-09-16  MR#: [id number]  WER#:[id number]  Patient Care Team: Birdie Sons, MD as PCP - General (Family Medicine) Manya Silvas, MD as Consulting Physician (Gastroenterology) Glenna Fellows, MD as Consulting Physician (Neurosurgery) Earnestine Leys, MD as Consulting Physician (Orthopedic Surgery) Murrell Redden, MD (Urology) Minna Merritts, MD as Consulting Physician (Cardiology)  CHIEF COMPLAINT: Hypercalcemia, declining performance status, ascites, malignant lymphoid neoplasm  INTERVAL HISTORY: Patient is a 75 year old male who was recently discharged from the hospital and initially evaluated in clinic. 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 Final pathology is pending.  Patient also had large-volume paracentesis during his last admission removing over 3 L of ascitic fluid. He presented to clinic today with a calcium level of 14.1, increased/should be in confusion as well as reaccumulating ascites and worsening peripheral edema. His noted to have renal insufficiency, but his creatinine of 1.8 is approximately his baseline. Much of the history was given by his family, the patient offered no further specific complaints.  REVIEW OF SYSTEMS:   Review of Systems  Constitutional: Negative for fever and weight loss.  Respiratory: Negative.  Negative for shortness of breath.   Cardiovascular: Negative.  Negative for chest pain.  Gastrointestinal: Positive for abdominal pain.  Musculoskeletal: Negative.   Neurological: Positive for weakness.       Confusion    As per HPI. Otherwise, a complete review of systems is negatve.  PAST MEDICAL HISTORY: Past Medical History  Diagnosis Date  . History of colon polyps 2012    a. Tubular adenoma excised by Tiffany Kocher  . History of kidney stones   .  Osteoarthritis   . Brachial neuritis   . GI bleed     a. Following polypectomy  . Biceps tendon rupture 2015  . Malignant melanoma of skin of scalp ([place]) 2011    a. Excised 2001 Dermatology [place] Thereasa Solo)  . PAF (paroxysmal atrial fibrillation) ([place])     a. 1997 - no known recurrence since;  b. CHA2DS2VASc = 4.  . Carotid arterial disease ([place])     a. 07/2014 Carotid U/S: bilat 0-39% stenosis.  . Degenerative joint disease     a. 2010 s/p L THA;  b. 2016 - pending L knee arthroplasty.  . H/O cardiac catheterization     a. 1997 - reportedly normal.  . Essential hypertension   . Diet-controlled diabetes mellitus ([place])   . Diabetes mellitus without complication ([place])   . Colonic mass     PAST SURGICAL HISTORY: Past Surgical History  Procedure Laterality Date  . Total hip arthroplasty Left 2010  . Melanoma on head  2011  . Lumbar laminectomy  2006    Dr Carloyn Manner  . Hand surgery  2012  . Carpal tunnel release  10/23/2011    left hand  . Lateral fusion lumbar spine, thoracotomy    . Melanoma excision Left 11/2011    Shoulder  . Cataract extraction, bilateral Bilateral 2013  . Nerve surgery Left 2011    Elbow  . Ulnar nerve repair  2010    Row  . Total knee arthroplasty Left 09/27/2015    Procedure: TOTAL KNEE ARTHROPLASTY;  Surgeon: Leanor Kail, MD;  Location: [facility] ORS;  Service: Orthopedics;  Laterality: Left;  . Joint replacement Left     knee  . Colonoscopy with propofol N/A 12/06/2015    Procedure: COLONOSCOPY WITH  PROPOFOL;  Surgeon: Lucilla Lame, MD;  Location: [facility] ENDOSCOPY;  Service: Endoscopy;  Laterality: N/A;    FAMILY HISTORY Family History  Problem Relation Age of Onset  . Bladder Cancer Father        ADVANCED DIRECTIVES:    HEALTH MAINTENANCE: Social History  Substance Use Topics  . Smoking status: Former Smoker -- 3.00 packs/day for 55 years    Types: Cigarettes    Quit date: 12/24/1987  . Smokeless tobacco: Not on file  . Alcohol Use: No      Colonoscopy:  PAP:  Bone density:  Lipid panel:  Allergies  Allergen Reactions  . Digoxin Other (See Comments)    Pt states that this medication caused his heart to stop.      Current Facility-Administered Medications  Medication Dose Route Frequency Provider Last Rate Last Dose  . 0.9 %  sodium chloride infusion   Intravenous Continuous Gladstone Lighter, MD 75 mL/hr at 11/25/2015 2049    . acetaminophen (TYLENOL) tablet 650 mg  650 mg Oral Q6H PRN Gladstone Lighter, MD       Or  . acetaminophen (TYLENOL) suppository 650 mg  650 mg Rectal Q6H PRN Gladstone Lighter, MD      . Derrill Memo ON 12/13/2015] allopurinol (ZYLOPRIM) tablet 200 mg  200 mg Oral BH-q7a Gladstone Lighter, MD      . Derrill Memo ON 12/13/2015] atorvastatin (LIPITOR) tablet 20 mg  20 mg Oral q morning - 10a Gladstone Lighter, MD      . diltiazem (CARDIZEM) tablet 60 mg  60 mg Oral TID Gladstone Lighter, MD      . docusate sodium (COLACE) capsule 100 mg  100 mg Oral BID Gladstone Lighter, MD      . enoxaparin (LOVENOX) injection 40 mg  40 mg Subcutaneous Q24H Gladstone Lighter, MD      . Derrill Memo ON 12/13/2015] feeding supplement (ENSURE ENLIVE) (ENSURE ENLIVE) liquid 237 mL  237 mL Oral BID BM Gladstone Lighter, MD      . finasteride (PROSCAR) tablet 5 mg  5 mg Oral Daily Gladstone Lighter, MD   5 mg at 11/15/2015 2100  . fluticasone (FLONASE) 50 MCG/ACT nasal spray 2 spray  2 spray Each Nare Daily Gladstone Lighter, MD      . gabapentin (NEURONTIN) tablet 600 mg  600 mg Oral QID Gladstone Lighter, MD      . HYDROcodone-acetaminophen (NORCO/VICODIN) 5-325 MG per tablet 1-2 tablet  1-2 tablet Oral Q4H PRN Gladstone Lighter, MD      . HYDROmorphone (DILAUDID) injection 2 mg  2 mg Intravenous Q4H PRN Gladstone Lighter, MD      . megestrol (MEGACE) 400 MG/10ML suspension 400 mg  400 mg Oral Daily Gladstone Lighter, MD      . multivitamin with minerals tablet 1 tablet  1 tablet Oral Daily Gladstone Lighter, MD      . ondansetron (ZOFRAN)  tablet 4 mg  4 mg Oral Q6H PRN Gladstone Lighter, MD       Or  . ondansetron (ZOFRAN) injection 4 mg  4 mg Intravenous Q6H PRN Gladstone Lighter, MD      . pantoprazole (PROTONIX) EC tablet 40 mg  40 mg Oral Daily Gladstone Lighter, MD      . sodium chloride flush (NS) 0.9 % injection 3 mL  3 mL Intravenous Q12H Gladstone Lighter, MD      . Derrill Memo ON 12/13/2015] tamsulosin (FLOMAX) capsule 0.4 mg  0.4 mg Oral q morning - 10a Gladstone Lighter, MD      .  traMADol (ULTRAM) tablet 50 mg  50 mg Oral Q6H PRN Gladstone Lighter, MD      . zolendronic acid (ZOMETA) 4 mg in sodium chloride 0.9 % 100 mL IVPB  4 mg Intravenous Once Lloyd Huger, MD        OBJECTIVE: Filed Vitals:   11/26/2015 2002 11/29/2015 2016  BP: 132/65   Pulse: 82 83  Temp: 98.2 F (36.8 C)   Resp: 18      Body mass index is 26.02 kg/(m^2).    ECOG FS:2 - Symptomatic, <50% confined to bed  General: Ill-appearing, no acute distress. Eyes: Pink conjunctiva, anicteric sclera. HEENT: Normocephalic, moist mucous membranes, clear oropharnyx. Lungs: Clear to auscultation bilaterally. Heart: Regular rate and rhythm. No rubs, murmurs, or gallops. Abdomen: Distended, nontender.  Musculoskeletal: 1-2+ lower extremity edema. Neuro: Mildly confused, but alert. Answering all questions appropriately. Cranial nerves grossly intact. Skin: No rashes or petechiae noted. Psych: Normal affect.   LAB RESULTS:  Lab Results  Component Value Date   NA 146* 12/07/2015   K 4.3 12/07/2015   CL 114* 12/07/2015   CO2 24 12/07/2015   GLUCOSE 77 12/07/2015   BUN 55* 12/07/2015   CREATININE 1.73* 12/07/2015   CALCIUM 11.9* 12/07/2015   PROT 6.1* 12/04/2015   ALBUMIN 3.4* 12/04/2015   AST 27 12/04/2015   ALT 18 12/04/2015   ALKPHOS 73 12/04/2015   BILITOT 1.0 12/04/2015   GFRNONAA 37* 12/07/2015   GFRAA 43* 12/07/2015    Lab Results  Component Value Date   WBC 8.9 12/06/2015   NEUTROABS 5.2 05/18/2009   HGB 9.6* 12/06/2015   HCT  30.3* 12/06/2015   MCV 89.9 12/06/2015   PLT 269 12/06/2015     STUDIES:   ASSESSMENT: Hypercalcemia, declining performance status, ascites, malignant lymphoid neoplasm  PLAN:    1. 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 Have ordered CT-guided bone marrow biopsy for further evaluation and to complete the staging workup. With patient's declining performance status, treatment would be difficult but family would like to obtain a final diagnosis before making a decision. 2. Hypercalcemia: Likely secondary to underlying malignancy. Patient's calcium level was 14.1 on outside laboratory work. Have ordered 4 mg IV Zometa. Continue IV fluids as ordered. 3. Ascites: Likely malignant. Patient had a greater than 3 L large volume paracentesis during his last admission. Patient will likely benefit from an additional paracentesis in the next several days. 4. Renal insufficiency: Patient's creatinine is 1.8 which appears to be approximately his baseline, monitor. 5. Disposition: Patient will likely remain the hospital for several days until his acute symptoms resolve.  Appreciate consult, will follow.  Lloyd Huger, MD   11/30/2015 9:53 PM

## 2015-12-12 NOTE — Progress Notes (Signed)
Family went to ER with abdominal bloating and pain and he was admitted.  He received work-up that included CT that showed a mass that lead to other testing.  Is a resident at [facility] since hospital discharge.  IIs accompanied by his family today that report he is having more adominal bloating, LE edema, and confusion.

## 2015-12-12 NOTE — Progress Notes (Signed)
This encounter was created in error - please disregard.

## 2015-12-12 NOTE — H&P (Signed)
[place] [facility] [place] NAME: Bob Christensen    MR#:  [id number]  DATE OF BIRTH:  02/16/1941  DATE OF ADMISSION:  11/23/2015  PRIMARY CARE PHYSICIAN: Lelon Huh, MD   REQUESTING/REFERRING PHYSICIAN: Dr. Delight Hoh  CHIEF COMPLAINT:  Abdominal distention, confusion  HISTORY OF PRESENT ILLNESS:  Bob Christensen  is a 75 y.o. male with a known history of hypertension, hyperlipidemia, carotid stenosis, paroxysmal atrial fibrillation, recent admission last week for intra-abdominal and colon mass status post biopsy possible lymphoma admitted to [facility] 4 days ago was sent back due to abnormal labs and also confusion. Histopathology from recent biopsy is still pending, possible lymphoma per oncology. Patient had blood work done today at [facility] and showed a calcium of 14.1. He was also complaining of extreme weakness, more confused than baseline. He was sent to see Dr. Grayland Ormond in the office who looked at the labs and requested admission. Patient also has been having worsening lower extremity edema not improving with Lasix tried at the rehabilitation. Also having worsening abdominal distention. He had 3 L of ascitic fluid taken out last week.  PAST MEDICAL HISTORY:   Past Medical History  Diagnosis Date  . History of colon polyps 2012    a. Tubular adenoma excised by Tiffany Kocher  . History of kidney stones   . Osteoarthritis   . Brachial neuritis   . GI bleed     a. Following polypectomy  . Biceps tendon rupture 2015  . Malignant melanoma of skin of scalp ([place]) 2011    a. Excised 2001 [facility])  . PAF (paroxysmal atrial fibrillation) ([place])     a. 1997 - no known recurrence since;  b. CHA2DS2VASc = 4.  . Carotid arterial disease ([place])     a. 07/2014 Carotid U/S: bilat 0-39% stenosis.  . Degenerative joint disease     a. 2010 s/p L THA;  b. 2016 - pending L knee arthroplasty.  . H/O cardiac  catheterization     a. 1997 - reportedly normal.  . Essential hypertension   . Diet-controlled diabetes mellitus ([place])   . Diabetes mellitus without complication ([place])   . Colonic mass     PAST SURGICAL HISTORY:   Past Surgical History  Procedure Laterality Date  . Total hip arthroplasty Left 2010  . Melanoma on head  2011  . Lumbar laminectomy  2006    Dr Carloyn Manner  . Hand surgery  2012  . Carpal tunnel release  10/23/2011    left hand  . Lateral fusion lumbar spine, thoracotomy    . Melanoma excision Left 11/2011    Shoulder  . Cataract extraction, bilateral Bilateral 2013  . Nerve surgery Left 2011    Elbow  . Ulnar nerve repair  2010    Row  . Total knee arthroplasty Left 09/27/2015    Procedure: TOTAL KNEE ARTHROPLASTY;  Surgeon: Leanor Kail, MD;  Location: [facility] ORS;  Service: Orthopedics;  Laterality: Left;  . Joint replacement Left     knee  . Colonoscopy with propofol N/A 12/06/2015    Procedure: COLONOSCOPY WITH PROPOFOL;  Surgeon: Lucilla Lame, MD;  Location: [facility] ENDOSCOPY;  Service: Endoscopy;  Laterality: N/A;    SOCIAL HISTORY:   Social History  Substance Use Topics  . Smoking status: Former Smoker -- 3.00 packs/day for 55 years    Types: Cigarettes    Quit date: 12/24/1987  . Smokeless tobacco: Not on file  . Alcohol  Use: No    FAMILY HISTORY:   Family History  Problem Relation Age of Onset  . Bladder Cancer Father     DRUG ALLERGIES:   Allergies  Allergen Reactions  . Digoxin Other (See Comments)    "Heart stop.  Shocked my heart"    REVIEW OF SYSTEMS:   Review of Systems  Constitutional: Positive for malaise/fatigue. Negative for fever, chills and weight loss.  HENT: Negative for ear discharge, ear pain, hearing loss and nosebleeds.   Eyes: Negative for blurred vision, double vision and photophobia.  Respiratory: Negative for cough, hemoptysis, shortness of breath and wheezing.   Cardiovascular: Positive for leg swelling. Negative for  chest pain, palpitations and orthopnea.  Gastrointestinal: Positive for abdominal pain. Negative for heartburn, nausea, vomiting, diarrhea, constipation and melena.       Abdominal distention  Genitourinary: Positive for frequency. Negative for dysuria and hematuria.  Musculoskeletal: Positive for back pain. Negative for myalgias and neck pain.  Skin: Negative for rash.  Neurological: Negative for dizziness, tremors, sensory change, speech change, focal weakness and headaches.       Confusion  Endo/Heme/Allergies: Does not bruise/bleed easily.  Psychiatric/Behavioral: Negative for depression.    MEDICATIONS AT HOME:   Prior to Admission medications   Medication Sig Start Date End Date Taking? Authorizing Provider  allopurinol (ZYLOPRIM) 100 MG tablet Take 200 mg by mouth every morning.     Historical Provider, MD  aspirin 81 MG EC tablet Take 81 mg by mouth daily.      Historical Provider, MD  atorvastatin (LIPITOR) 20 MG tablet Take 20 mg by mouth every morning. Reported on 12/05/2015 02/16/15   Historical Provider, MD  diltiazem (CARDIZEM) 60 MG tablet Take 1 tablet (60 mg total) by mouth 3 (three) times daily. 05/21/15   Birdie Sons, MD  docusate sodium (COLACE) 100 MG capsule Take 1 capsule (100 mg total) by mouth 2 (two) times daily. 12/08/15   Fritzi Mandes, MD  feeding supplement, ENSURE ENLIVE, (ENSURE ENLIVE) LIQD Take 237 mLs by mouth 2 (two) times daily between meals. Patient not taking: Reported on 12/04/2015 12/08/15   Fritzi Mandes, MD  finasteride (PROSCAR) 5 MG tablet Take 5 mg by mouth daily.      Historical Provider, MD  fluticasone (FLONASE) 50 MCG/ACT nasal spray Place 2 sprays into both nostrils daily. 11/29/15   Birdie Sons, MD  furosemide (LASIX) 20 MG tablet Take 20 mg by mouth daily.    Historical Provider, MD  gabapentin (NEURONTIN) 600 MG tablet Take 600 mg by mouth 4 (four) times daily. 02/16/15   Historical Provider, MD  GLUCOSAMINE-CHONDROITIN-VIT C PO Take 1 capsule  by mouth daily.      Historical Provider, MD  HYDROmorphone (DILAUDID) 2 MG tablet Take 1 tablet (2 mg total) by mouth every 6 (six) hours as needed for severe pain. 12/08/15   Fritzi Mandes, MD  lansoprazole (PREVACID 24HR) 15 MG capsule Take 15 mg by mouth daily.    Historical Provider, MD  megestrol (MEGACE) 400 MG/10ML suspension Take 10 mLs (400 mg total) by mouth daily. 12/08/15   Fritzi Mandes, MD  Multiple Vitamins-Minerals (CENTRUM SILVER PO) Take 1 tablet by mouth daily. Reported on 11/14/2015    Historical Provider, MD  Omega 3 340 MG CPDR Take 340 mg by mouth daily.    Historical Provider, MD  Tamsulosin HCl (FLOMAX) 0.4 MG CAPS Take 0.4 mg by mouth every morning.     Historical Provider, MD  traMADol Veatrice Bourbon)  50 MG tablet Take 1 tablet (50 mg total) by mouth every 6 (six) hours as needed for moderate pain (mild to moderate pain). 12/08/15   Fritzi Mandes, MD      VITAL SIGNS:  Blood pressure 132/65, pulse 83, temperature 98.2 F (36.8 C), temperature source Oral, resp. rate 18, height '5\' 8"'$  (1.727 m), weight 77.61 kg (171 lb 1.6 oz), SpO2 96 %.  PHYSICAL EXAMINATION:   Physical Exam  GENERAL:  75 y.o.-year-old patient lying in the bed with no acute distress.  EYES: Pupils round, reactive to light and accommodation. Right pupil is dilated and irregular. No scleral icterus. Extraocular muscles intact.  HEENT: Head atraumatic, normocephalic. Oropharynx and nasopharynx clear.  NECK:  Supple, no jugular venous distention. No thyroid enlargement, no tenderness.  LUNGS: Normal breath sounds bilaterally, no wheezing, rales,rhonchi or crepitation. No use of accessory muscles of respiration. Decreased bibasilar breath sounds noted. CARDIOVASCULAR: S1, S2 normal. No rubs, or gallops. 3/6 systolic murmur is present. ABDOMEN: Soft, nontender, distended but soft. Bowel sounds present. No organomegaly or mass.  EXTREMITIES: 3+ pedal edema,  No cyanosis, or clubbing.  NEUROLOGIC: Cranial nerves II through  XII are intact. Muscle strength 5/5 in all extremities. Sensation intact. Gait not checked. Generalized weakness noted. PSYCHIATRIC: The patient is alert and oriented x 2. Confused now  SKIN: No obvious rash, lesion, or ulcer.   LABORATORY PANEL:   CBC  Recent Labs Lab 12/06/15 0600  WBC 8.9  HGB 9.6*  HCT 30.3*  PLT 269   ------------------------------------------------------------------------------------------------------------------  Chemistries   Recent Labs Lab 12/07/15 0443  NA 146*  K 4.3  CL 114*  CO2 24  GLUCOSE 77  BUN 55*  CREATININE 1.73*  CALCIUM 11.9*   ------------------------------------------------------------------------------------------------------------------  Cardiac Enzymes No results for input(s): TROPONINI in the last 168 hours. ------------------------------------------------------------------------------------------------------------------  RADIOLOGY:  No results found.  EKG:   Orders placed or performed during the hospital encounter of 12/04/15  . EKG 12-Lead  . EKG 12-Lead  . ED EKG  . ED EKG  . EKG    IMPRESSION AND PLAN:   Bob Christensen  is a 75 y.o. male with a known history of hypertension, hyperlipidemia, carotid stenosis, paroxysmal atrial fibrillation, recent admission last week for intra-abdominal and colon mass status post biopsy possible lymphoma admitted to [facility] 4 days ago was sent back due to abnormal labs and also confusion.  #1 Hypercalcemia- likely from cancer and also dehydration - discontinue lasix, IV fluids - discussed zometa with Dr. Grayland Ormond-  He will look into it and order it - recheck in AM  #2 Possible Lymphoma-intra-abdominal mass found last week, status post biopsy results pending. -Possible lymphoma. Oncology consulted -Schedule for bone marrow biopsy this hospitalization- per oncology -Abdominal distention-3 L ascites fluid drawn last week, Nothing by mouth again after midnight for  possible IR paracentesis tomorrow  #3 paroxysmal atrial fibrillation-Cardizem 3 times a day. -No anticoagulation due to need for biopsies  #4 BPH-continue home medications. -On Flomax and Proscar  #5 lower extremity edema-could be from the intra-abdominal mass compressing on veins and impending the return. -Ted's stockings, keep the legs elevated -Check Dopplers to rule out DVT  #6 Neuropathy-continue gabapentin  #7 DVT prophylaxis-on Lovenox- hold as he may need bone marrow biopsy  Physical therapy consult and social worker consult   All the records are reviewed and case discussed with ED provider. Management plans discussed with the patient, family and they are in agreement.  CODE STATUS: Full code  TOTAL TIME TAKING CARE OF THIS PATIENT: 50 minutes.    Bob Christensen M.D on 11/17/2015 at 9:00 PM  Between 7am to 6pm - Pager - [phone]  After 6pm go to [email or web address] - password [email or web address] [facility]  Office  [phone]  CC: Primary care physician; Lelon Huh, MD

## 2015-12-12 NOTE — NC FL2 (Signed)
[place] LEVEL OF CARE SCREENING TOOL     IDENTIFICATION  Patient Name: Bob Christensen Birthdate: [birth year] Sex: male Admission Date (Current Location): 11/20/2015  [place] and [place] Number:  Engineering geologist and Address:  [facility], [address] [place], [place] [address]      Provider Number: [id number]  Attending Physician Name and Address:  Demetrios Loll, MD  Relative Name and Phone Number:       Current Level of Care: SNF Recommended Level of Care: [place] Prior Approval Number:    Date Approved/Denied:   PASRR Number: [id number]  Discharge Plan: SNF    Current Diagnoses: Patient Active Problem List   Diagnosis Date Noted  . Abnormal findings-gastrointestinal tract   . Hypercalcemia 12/04/2015  . S/P total knee replacement 09/27/2015  . PAF (paroxysmal atrial fibrillation) ([place])   . Arthritis of knee, degenerative 06/06/2015  . Carpal tunnel syndrome on both sides 05/31/2015  . Pre-ulcerative calluses 05/26/2015  . Overweight 05/26/2015  . Numbness of hand 05/26/2015  . History of kidney stones 05/26/2015  . GI (gastrointestinal bleed) 05/26/2015  . Back pain with radiation 05/26/2015  . Arthralgia 05/26/2015  . Anemia 05/26/2015  . Fatigue 06/02/2013  . Elevated prostate specific antigen (PSA) 09/11/2012  . Chronic pain disorder 07/27/2012  . Numbness in feet 07/30/2011  . Diabetes mellitus with nephropathy ([place]) 04/06/2010  . Hyperlipidemia, mixed 04/06/2010  . Hypertension, essential, benign 04/06/2010  . Abnormal prostate specific antigen 03/06/2010  . GERD (gastroesophageal reflux disease) 08/03/2009  . Benign prostatic hypertrophy without urinary obstruction 11/12/2007  . Gout 05/01/2006  . History of atrial fibrillation 11/12/1995    Orientation RESPIRATION BLADDER Height & Weight     Self, Time, Situation, Place  Normal Continent Weight: 171 lb 1.6 oz (77.61 kg) Height:  5'  8" (172.7 cm)  BEHAVIORAL SYMPTOMS/MOOD NEUROLOGICAL BOWEL NUTRITION STATUS  Wanderer   Continent Diet (2 grams Sodium, Thin Liquids)  AMBULATORY STATUS COMMUNICATION OF NEEDS Skin   Extensive Assist Verbally Normal                       Personal Care Assistance Level of Assistance  Bathing, Feeding, Dressing Bathing Assistance: Limited assistance Feeding assistance: Independent Dressing Assistance: Limited assistance     Functional Limitations Info    Sight Info: Adequate Hearing Info: Adequate Speech Info: Adequate    SPECIAL CARE FACTORS FREQUENCY  PT (By licensed PT)     PT Frequency: 5              Contractures      Additional Factors Info  Code Status, Allergies Code Status Info: Full Code Allergies Info: Digoxin           Current Medications (12/09/2015):  This is the current hospital active medication list Current Facility-Administered Medications  Medication Dose Route Frequency Provider Last Rate Last Dose  . 0.9 %  sodium chloride infusion   Intravenous Continuous Gladstone Lighter, MD 75 mL/hr at 11/23/2015 2049    . acetaminophen (TYLENOL) tablet 650 mg  650 mg Oral Q6H PRN Gladstone Lighter, MD       Or  . acetaminophen (TYLENOL) suppository 650 mg  650 mg Rectal Q6H PRN Gladstone Lighter, MD      . Derrill Memo ON 12/13/2015] allopurinol (ZYLOPRIM) tablet 200 mg  200 mg Oral Maury Dus, MD      . Derrill Memo ON 12/13/2015] atorvastatin (LIPITOR) tablet 20 mg  20 mg Oral q morning - 10a Gladstone Lighter, MD      . diltiazem (CARDIZEM) tablet 60 mg  60 mg Oral TID Gladstone Lighter, MD   60 mg at 11/13/2015 2143  . docusate sodium (COLACE) capsule 100 mg  100 mg Oral BID Gladstone Lighter, MD   100 mg at 12/06/2015 2143  . enoxaparin (LOVENOX) injection 40 mg  40 mg Subcutaneous Q24H Gladstone Lighter, MD   40 mg at 11/15/2015 2143  . [START ON 12/13/2015] feeding supplement (ENSURE ENLIVE) (ENSURE ENLIVE) liquid 237 mL  237 mL Oral BID BM Gladstone Lighter, MD      . finasteride (PROSCAR) tablet 5 mg  5 mg Oral Daily Gladstone Lighter, MD   5 mg at 12/07/2015 2100  . fluticasone (FLONASE) 50 MCG/ACT nasal spray 2 spray  2 spray Each Nare Daily Gladstone Lighter, MD   2 spray at 12/04/2015 2145  . gabapentin (NEURONTIN) tablet 600 mg  600 mg Oral QID Gladstone Lighter, MD   600 mg at 11/26/2015 2143  . HYDROcodone-acetaminophen (NORCO/VICODIN) 5-325 MG per tablet 1-2 tablet  1-2 tablet Oral Q4H PRN Gladstone Lighter, MD      . HYDROmorphone (DILAUDID) injection 2 mg  2 mg Intravenous Q4H PRN Gladstone Lighter, MD      . megestrol (MEGACE) 400 MG/10ML suspension 400 mg  400 mg Oral Daily Gladstone Lighter, MD   400 mg at 12/01/2015 2144  . multivitamin with minerals tablet 1 tablet  1 tablet Oral Daily Gladstone Lighter, MD   1 tablet at 11/30/2015 2143  . ondansetron (ZOFRAN) tablet 4 mg  4 mg Oral Q6H PRN Gladstone Lighter, MD       Or  . ondansetron (ZOFRAN) injection 4 mg  4 mg Intravenous Q6H PRN Gladstone Lighter, MD      . pantoprazole (PROTONIX) EC tablet 40 mg  40 mg Oral Daily Gladstone Lighter, MD   40 mg at 11/22/2015 2143  . sodium chloride flush (NS) 0.9 % injection 3 mL  3 mL Intravenous Q12H Gladstone Lighter, MD   3 mL at 11/22/2015 2154  . [START ON 12/13/2015] tamsulosin (FLOMAX) capsule 0.4 mg  0.4 mg Oral q morning - 10a Gladstone Lighter, MD      . traMADol Veatrice Bourbon) tablet 50 mg  50 mg Oral Q6H PRN Gladstone Lighter, MD      . zolendronic acid (ZOMETA) 4 mg in sodium chloride 0.9 % 100 mL IVPB  4 mg Intravenous Once Lloyd Huger, MD   4 mg at 12/04/2015 2224     Discharge Medications: Please see discharge summary for a list of discharge medications.  Relevant Imaging Results:  Relevant Lab Results:   Additional Information SSN:  [id number]  Darden Dates, LCSW

## 2015-12-13 ENCOUNTER — Inpatient Hospital Stay: Payer: Medicare Other

## 2015-12-13 DIAGNOSIS — J9601 Acute respiratory failure with hypoxia: Secondary | ICD-10-CM

## 2015-12-13 DIAGNOSIS — R579 Shock, unspecified: Secondary | ICD-10-CM

## 2015-12-13 LAB — BASIC METABOLIC PANEL
ANION GAP: 6 (ref 5–15)
BUN: 54 mg/dL — ABNORMAL HIGH (ref 6–20)
CALCIUM: 13.8 mg/dL — AB (ref 8.9–10.3)
CO2: 28 mmol/L (ref 22–32)
Chloride: 108 mmol/L (ref 101–111)
Creatinine, Ser: 1.9 mg/dL — ABNORMAL HIGH (ref 0.61–1.24)
GFR, EST AFRICAN AMERICAN: 38 mL/min — AB (ref >60)
GFR, EST NON AFRICAN AMERICAN: 33 mL/min — AB (ref >60)
Glucose, Bld: 96 mg/dL (ref 65–99)
POTASSIUM: 4.4 mmol/L (ref 3.5–5.1)
SODIUM: 142 mmol/L (ref 135–145)

## 2015-12-13 LAB — BLOOD GAS, ARTERIAL
ACID-BASE DEFICIT: 1.3 mmol/L (ref 0.0–2.0)
Acid-base deficit: 5.8 mmol/L — ABNORMAL HIGH (ref 0.0–2.0)
Bicarbonate: 18.8 mEq/L — ABNORMAL LOW (ref 21.0–28.0)
Bicarbonate: 18.5 mEq/L — ABNORMAL LOW (ref 21.0–28.0)
DELIVERY SYSTEMS: POSITIVE
Expiratory PAP: 10
FIO2: 1
FIO2: 1
Inspiratory PAP: 16
MECHVT: 600 mL
Mechanical Rate: 20
O2 Saturation: 95 %
O2 Saturation: 82.1 %
PATIENT TEMPERATURE: 37
PCO2 ART: 21 mmHg — AB (ref 32.0–48.0)
PEEP: 5 cmH2O
PH ART: 7.56 — AB (ref 7.350–7.450)
PO2 ART: 64 mmHg — AB (ref 83.0–108.0)
Patient temperature: 37
pCO2 arterial: 32 mmHg (ref 32.0–48.0)
pH, Arterial: 7.37 (ref 7.350–7.450)
pO2, Arterial: 48 mmHg — ABNORMAL LOW (ref 83.0–108.0)

## 2015-12-13 LAB — COMPREHENSIVE METABOLIC PANEL
ALBUMIN: 2.5 g/dL — AB (ref 3.5–5.0)
ALK PHOS: 67 U/L (ref 38–126)
ALT: 15 U/L — ABNORMAL LOW (ref 17–63)
ANION GAP: 10 (ref 5–15)
AST: 20 U/L (ref 15–41)
BILIRUBIN TOTAL: 1.1 mg/dL (ref 0.3–1.2)
BUN: 52 mg/dL — ABNORMAL HIGH (ref 6–20)
CALCIUM: 13.6 mg/dL — AB (ref 8.9–10.3)
CO2: 24 mmol/L (ref 22–32)
Chloride: 106 mmol/L (ref 101–111)
Creatinine, Ser: 1.75 mg/dL — ABNORMAL HIGH (ref 0.61–1.24)
GFR calc Af Amer: 42 mL/min — ABNORMAL LOW (ref >60)
GFR calc non Af Amer: 37 mL/min — ABNORMAL LOW (ref >60)
GLUCOSE: 98 mg/dL (ref 65–99)
POTASSIUM: 4 mmol/L (ref 3.5–5.1)
SODIUM: 140 mmol/L (ref 135–145)
TOTAL PROTEIN: 4.7 g/dL — AB (ref 6.5–8.1)

## 2015-12-13 LAB — LACTIC ACID, PLASMA
LACTIC ACID, VENOUS: 2.8 mmol/L — AB (ref 0.5–2.0)
Lactic Acid, Venous: 2 mmol/L (ref 0.5–2.0)

## 2015-12-13 LAB — CBC
HEMATOCRIT: 33.4 % — AB (ref 40.0–52.0)
HEMATOCRIT: 30.1 % — AB (ref 40.0–52.0)
HEMOGLOBIN: 10.7 g/dL — AB (ref 13.0–18.0)
HEMOGLOBIN: 9.6 g/dL — AB (ref 13.0–18.0)
MCH: 28.1 pg (ref 26.0–34.0)
MCH: 28.2 pg (ref 26.0–34.0)
MCHC: 32 g/dL (ref 32.0–36.0)
MCHC: 31.9 g/dL — ABNORMAL LOW (ref 32.0–36.0)
MCV: 88.1 fL (ref 80.0–100.0)
MCV: 88.3 fL (ref 80.0–100.0)
Platelets: 257 10*3/uL (ref 150–440)
Platelets: 192 10*3/uL (ref 150–440)
RBC: 3.79 MIL/uL — ABNORMAL LOW (ref 4.40–5.90)
RBC: 3.41 MIL/uL — AB (ref 4.40–5.90)
RDW: 17.4 % — ABNORMAL HIGH (ref 11.5–14.5)
RDW: 17.4 % — AB (ref 11.5–14.5)
WBC: 7.9 10*3/uL (ref 3.8–10.6)
WBC: 3.8 10*3/uL (ref 3.8–10.6)

## 2015-12-13 LAB — GLUCOSE, CAPILLARY: GLUCOSE-CAPILLARY: 104 mg/dL — AB (ref 65–99)

## 2015-12-13 LAB — PROTIME-INR
INR: 1.18
Prothrombin Time: 15.2 seconds — ABNORMAL HIGH (ref 11.4–15.0)

## 2015-12-13 LAB — MRSA PCR SCREENING: MRSA by PCR: NEGATIVE

## 2015-12-13 LAB — APTT: APTT: 24 s (ref 24–36)

## 2015-12-13 LAB — AMMONIA: AMMONIA: 48 umol/L — AB (ref 9–35)

## 2015-12-13 MED ORDER — DOCUSATE SODIUM 50 MG/5ML PO LIQD
100.0000 mg | Freq: Two times a day (BID) | ORAL | Status: DC
  Administered 2015-12-14: 100 mg
  Filled 2015-12-13 (×2): qty 10

## 2015-12-13 MED ORDER — ROCURONIUM BROMIDE 50 MG/5ML IV SOLN
INTRAVENOUS | Status: AC
  Administered 2015-12-13: 10 mg
  Filled 2015-12-13: qty 1

## 2015-12-13 MED ORDER — NOREPINEPHRINE BITARTRATE 1 MG/ML IV SOLN
0.0000 ug/min | INTRAVENOUS | Status: DC
  Administered 2015-12-13: 2 ug/min via INTRAVENOUS
  Filled 2015-12-13 (×3): qty 4

## 2015-12-13 MED ORDER — DEXTROSE 5 % IV SOLN
500.0000 mg | INTRAVENOUS | Status: DC
  Administered 2015-12-13 – 2015-12-14 (×2): 500 mg via INTRAVENOUS
  Filled 2015-12-13 (×3): qty 500

## 2015-12-13 MED ORDER — FENTANYL CITRATE (PF) 100 MCG/2ML IJ SOLN: 25.0000 ug | INTRAMUSCULAR | Status: DC | PRN

## 2015-12-13 MED ORDER — PANTOPRAZOLE SODIUM 40 MG IV SOLR
40.0000 mg | INTRAVENOUS | Status: DC
  Administered 2015-12-13 – 2015-12-14 (×2): 40 mg via INTRAVENOUS
  Filled 2015-12-13 (×2): qty 40

## 2015-12-13 MED ORDER — FENTANYL CITRATE (PF) 100 MCG/2ML IJ SOLN
INTRAMUSCULAR | Status: AC
  Administered 2015-12-13: 100 ug
  Filled 2015-12-13: qty 2

## 2015-12-13 MED ORDER — CHLORHEXIDINE GLUCONATE 0.12% ORAL RINSE (MEDLINE KIT)
15.0000 mL | Freq: Two times a day (BID) | OROMUCOSAL | Status: DC
  Administered 2015-12-13 – 2015-12-14 (×3): 15 mL via OROMUCOSAL
  Filled 2015-12-13 (×4): qty 15

## 2015-12-13 MED ORDER — CETYLPYRIDINIUM CHLORIDE 0.05 % MT LIQD
7.0000 mL | Freq: Two times a day (BID) | OROMUCOSAL | Status: DC
  Administered 2015-12-14: 7 mL via OROMUCOSAL

## 2015-12-13 MED ORDER — SODIUM CHLORIDE 0.9 % IV BOLUS (SEPSIS)
500.0000 mL | Freq: Once | INTRAVENOUS | Status: AC
  Administered 2015-12-13: 500 mL via INTRAVENOUS

## 2015-12-13 MED ORDER — PIPERACILLIN-TAZOBACTAM 3.375 G IVPB
3.3750 g | Freq: Three times a day (TID) | INTRAVENOUS | Status: DC
  Administered 2015-12-13 – 2015-12-14 (×4): 3.375 g via INTRAVENOUS
  Filled 2015-12-13 (×6): qty 50

## 2015-12-13 MED ORDER — VANCOMYCIN HCL IN DEXTROSE 1-5 GM/200ML-% IV SOLN
1000.0000 mg | Freq: Once | INTRAVENOUS | Status: AC
  Administered 2015-12-13: 1000 mg via INTRAVENOUS
  Filled 2015-12-13: qty 200

## 2015-12-13 MED ORDER — BUPIVACAINE HCL (PF) 0.25 % IJ SOLN
INTRAMUSCULAR | Status: AC
  Filled 2015-12-13: qty 30

## 2015-12-13 MED ORDER — FENTANYL CITRATE (PF) 100 MCG/2ML IJ SOLN
INTRAMUSCULAR | Status: AC
  Filled 2015-12-13: qty 2

## 2015-12-13 MED ORDER — DOCUSATE SODIUM 50 MG/5ML PO LIQD: 100.0000 mg | Freq: Two times a day (BID) | ORAL | Status: DC | PRN

## 2015-12-13 MED ORDER — FENTANYL CITRATE (PF) 100 MCG/2ML IJ SOLN
100.0000 ug | INTRAMUSCULAR | Status: AC | PRN
  Administered 2015-12-13 (×3): 100 ug via INTRAVENOUS
  Filled 2015-12-13 (×3): qty 2

## 2015-12-13 MED ORDER — VASOPRESSIN 20 UNIT/ML IV SOLN
0.0400 [IU]/min | INTRAVENOUS | Status: DC
  Administered 2015-12-13: 0.03 [IU]/min via INTRAVENOUS
  Administered 2015-12-14: 0.04 [IU]/min via INTRAVENOUS
  Filled 2015-12-13 (×2): qty 2

## 2015-12-13 MED ORDER — GABAPENTIN 300 MG PO CAPS
300.0000 mg | ORAL_CAPSULE | Freq: Four times a day (QID) | ORAL | Status: DC
  Administered 2015-12-14 (×3): 300 mg
  Filled 2015-12-13 (×4): qty 1

## 2015-12-13 MED ORDER — VANCOMYCIN HCL IN DEXTROSE 1-5 GM/200ML-% IV SOLN
1000.0000 mg | INTRAVENOUS | Status: DC
  Administered 2015-12-13: 1000 mg via INTRAVENOUS
  Filled 2015-12-13 (×2): qty 200

## 2015-12-13 MED ORDER — MIDAZOLAM HCL 5 MG/5ML IJ SOLN
INTRAMUSCULAR | Status: AC
  Filled 2015-12-13: qty 5

## 2015-12-13 MED ORDER — SODIUM CHLORIDE 0.9 % IV BOLUS (SEPSIS)
1000.0000 mL | Freq: Once | INTRAVENOUS | Status: AC
  Administered 2015-12-13: 1000 mL via INTRAVENOUS

## 2015-12-13 MED ORDER — MIDAZOLAM HCL 2 MG/2ML IJ SOLN: 2.0000 mg | INTRAMUSCULAR | Status: DC | PRN

## 2015-12-13 MED ORDER — NOREPINEPHRINE BITARTRATE 1 MG/ML IV SOLN
0.0000 ug/min | INTRAVENOUS | Status: DC
  Administered 2015-12-14: 40 ug/min via INTRAVENOUS
  Administered 2015-12-14 (×2): 50 ug/min via INTRAVENOUS
  Administered 2015-12-14: 20 ug/min via INTRAVENOUS
  Administered 2015-12-14: 50 ug/min via INTRAVENOUS
  Filled 2015-12-13 (×4): qty 16

## 2015-12-13 MED ORDER — CALCITONIN (SALMON) 200 UNIT/ML IJ SOLN
400.0000 [IU] | Freq: Two times a day (BID) | INTRAMUSCULAR | Status: DC
  Administered 2015-12-13 – 2015-12-14 (×3): 400 [IU] via SUBCUTANEOUS
  Filled 2015-12-13 (×5): qty 2

## 2015-12-13 MED ORDER — MIDAZOLAM HCL 2 MG/2ML IJ SOLN
2.0000 mg | INTRAMUSCULAR | Status: AC | PRN
  Administered 2015-12-13 – 2015-12-14 (×3): 2 mg via INTRAVENOUS
  Filled 2015-12-13 (×3): qty 2

## 2015-12-13 MED ORDER — ANTISEPTIC ORAL RINSE SOLUTION (CORINZ)
7.0000 mL | Freq: Four times a day (QID) | OROMUCOSAL | Status: DC
Start: 2015-12-14 — End: 2015-12-15
  Administered 2015-12-13 – 2015-12-14 (×4): 7 mL via OROMUCOSAL
  Filled 2015-12-13 (×6): qty 7

## 2015-12-13 MED ORDER — FENTANYL 2500MCG IN NS 250ML (10MCG/ML) PREMIX INFUSION
25.0000 ug/h | INTRAVENOUS | Status: DC
  Administered 2015-12-13: 50 ug/h via INTRAVENOUS
  Filled 2015-12-13: qty 250

## 2015-12-13 NOTE — Progress Notes (Signed)
Notified [facility] RN about BP 88/53- after 1 liter bolus.  Patient also on nonrebreather breathing 35- 40 times/min.  Waiting on call back from Dr. Elsworth Soho- he is on the phone at this time.

## 2015-12-13 NOTE — Clinical Social Work Note (Signed)
Clinical Social Work Assessment  Patient Details  Name: Bob Christensen MRN: [id number] Date of Birth: 05-06-1941  Date of referral:  12/13/15               Reason for consult:  Facility Placement                Permission sought to share information with:  Family Supports Permission granted to share information::  Yes, Verbal Permission Granted  Name::     [place], son   Housing/Transportation Living arrangements for the past 2 months:  [place] of Information:  Adult Children Patient Interpreter Needed:  None Criminal Activity/Legal Involvement Pertinent to Current Situation/Hospitalization:  No - Comment as needed Significant Relationships:  Adult Children, Other Family Members, Significant Other Lives with:  Facility Resident Do you feel safe going back to the place where you live?  Yes Need for family participation in patient care:  Yes (Comment)  Care giving concerns:  No care giving concerns identified.   Social Worker assessment / plan:  CSW met with pt's family outside of ICU. Pt was receiving care by RNs and was transferred from 1C. Pt was admitted from [facility], however was a direct admit from the [facility]. CSW introduced herself and explained role of social work, as well as the process of returning to facility. Pt does not have a POA. However, he has adult children who are involved in his care. Pt is able to return to facility, per admission coordinator at [facility]. Pt's family is hopeful that pt can return to facility when stable. CSW will send referral to facility. CSW will continue to follow.   Employment status:  Retired Forensic scientist:  Medicare PT Recommendations:  Not assessed at this time [place] / Referral to community resources:  Other (Comment Required) Oceanographer)  Patient/Family's Response to care:  Pt's family was appreciative of CSW support.   Patient/Family's Understanding of and Emotional  Response to Diagnosis, Current Treatment, and Prognosis:  Pt's family is aware that pt will likely need to return to SNF for STR.   Emotional Assessment Appearance:  Appears stated age Attitude/Demeanor/Rapport:   (in ICU ) Affect (typically observed):  Other (in ICU) Orientation:  Fluctuating Orientation (Suspected and/or reported [place]) Alcohol / Substance use:  Never Used Psych involvement (Current and /or in the community):  No (Comment)  Discharge Needs  Concerns to be addressed:  Adjustment to Illness Readmission within the last 30 days:  Yes Current discharge risk:  Chronically ill Barriers to Discharge:  Continued Medical Work up   [employer], LCSW 12/13/2015, 1:37 PM

## 2015-12-13 NOTE — Care Management (Signed)
Admitted to [facility] with the diagnosis of hypercalcemia. Discharged from this facility 12/08/15 to [facility]. Son is Egbert [phone]). Girlfriend is Ann. Lives alone prior to previous admission. Dr. Caryn Section is listed as primary care physician.  Temperature 99.3-101.4 ----- this morning. Lethargic. Dr. Bridgett Larsson updated. Transferred to ICU.  Shelbie Ammons RN MSN CCM Care Management [phone]

## 2015-12-13 NOTE — Progress Notes (Signed)
Patient transferring to CCU. Report given to [facility].

## 2015-12-13 NOTE — Procedures (Signed)
Oral Intubation Procedure Note   Procedure: Intubation Indications: Respiratory insufficiency/failure Consent: Unable to obtain consent because of altered level of consciousness. Time Out: Verified patient identification, verified procedure, site/side was marked, verified correct patient position, special equipment/implants available, medications/allergies/relevent history reviewed, required imaging and test results available.   Pre-meds: Etomidate 20 mg IV  Neuromuscular blockade: Rocuronium 50 mg IV  Laryngoscope: #3 MAC  Visualization: cords fully visualized  ETT: 8.0 ETT passed on first attempt and secured @ 22 cm at upper incisors  Findings: normal upper airway   Evaluation:  B breath sounds noted Positive color change CXR revealed proper position of ETT Pt tolerated procedure well without complications   Merton Border, MD ; [facility] service Mobile [phone].  After 5:30 PM or weekends, call [phone]

## 2015-12-13 NOTE — Progress Notes (Signed)
Notified [place] RN about bp 80/40 and that I gave patient a 549ml bolus earlier- waiting on orders from Dr. Eartha Inch who is on the phone at this time.

## 2015-12-13 NOTE — Progress Notes (Signed)
°   12/13/15 1800  Clinical Encounter Type  Visited With Patient;Health care provider  Visit Type Initial  Referral From Nurse  Consult/Referral To Chaplain  Responded to request for consult.  Patient sleeping. [place] Ext [phone]

## 2015-12-13 NOTE — Progress Notes (Signed)
PT Cancellation Note  Patient Details Name: Bob Christensen MRN: [id number] DOB: 05/10/1941   Cancelled Treatment:    Reason Eval/Treat Not Completed: Medical issues which prohibited therapy (Per continued chart review, patient noted with transfer to CCU due to decline in medical status.  Will require new orders to re-initiate PT services; please re-enter as appropriate.)   Lalani Winkles H. Owens Shark, PT, DPT, NCS 12/13/2015, 4:30 PM [phone]

## 2015-12-13 NOTE — Progress Notes (Signed)
eLink Physician-Brief Progress Note Patient Name: Bob Christensen DOB: 02/19/1941 MRN: [id number]   Date of Service  12/13/2015  HPI/Events of Note  Peritoneal mets , biopsy results pending Hypotensive, hypoxic  eICU Interventions  RLL ASD/ effusion worse Bipap - remains hypoxic D/w daughter carolyn - OK to proceed with intubation- d/w hospitalist & intensivist on call     Intervention Category Major Interventions: Respiratory failure - evaluation and management  ALVA,RAKESH V. 12/13/2015, 7:48 PM

## 2015-12-13 NOTE — Progress Notes (Signed)
eLink Physician-Brief Progress Note Patient Name: DEMETRE CANDANOZA DOB: 06/23/1941 MRN: [id number]   Date of Service  12/13/2015  HPI/Events of Note  Patient with worsening shock, on levo at 49mcg, MAP 55,  FiO2=100%, PEEP=8, dyssynchronous on the vent  eICU Interventions  Start vasopressin Fentanyl gtt     Intervention Category Major Interventions: Respiratory failure - evaluation and management  Crimson Beer 12/13/2015, 11:16 PM

## 2015-12-13 NOTE — Progress Notes (Signed)
Notified Dr. Juanell Fairly of lactic acid. No new orders.

## 2015-12-13 NOTE — Progress Notes (Signed)
PT Cancellation Note  Patient Details Name: Bob Christensen MRN: [id number] DOB: Jun 16, 1941   Cancelled Treatment:    Reason Eval/Treat Not Completed: Medical issues which prohibited therapy (Consult received and chart reviewed.  Patient noted with pending LE dopplers to rule out DVT.  Will hold therapy until results received and patient cleared for activity.)   Damiano Stamper H. Owens Shark, PT, DPT, NCS 12/13/2015, 8:44 AM [phone]

## 2015-12-13 NOTE — Progress Notes (Signed)
eLink Physician-Brief Progress Note Patient Name: Bob Christensen DOB: 22-Sep-1941 MRN: [id number]   Date of Service  12/13/2015  HPI/Events of Note  Remains hypoxic, satn-84%, PO2 corelates  eICU Interventions  Trial PEEP 10 - may not be PEEP responsive since unilateral process     Intervention Category Major Interventions: Respiratory failure - evaluation and management  ALVA,RAKESH V. 12/13/2015, 10:24 PM

## 2015-12-13 NOTE — Progress Notes (Signed)
Patient has temperature 101.4 and is very lethargic Orders per Dr Bridgett Larsson ABG stat

## 2015-12-13 NOTE — Consult Note (Addendum)
PULMONARY / CRITICAL CARE MEDICINE   Name: Bob Christensen MRN: [id number] DOB: August 27, 1941    ADMISSION DATE:  12/11/2015 CONSULTATION DATE:  12/13/15  HISTORY OF PRESENT ILLNESS:   75 M recently hospitalized for abdominal mass and s/p CT guided biopsy of LLQ mass. Discharged to [facility] 01/27. Readmitted 01/31 with AMS and severe hypercalcemia. Transferred to ICU with worsening dyspnea, hypotension, lactic acidosis, RLL infiltrate. Initially supported with BiPAP but had continued hypoxemia. I was asked to intubate pt. He was somnolent with markedly increased WOB despite the BiPAP @ the time of my evaluation. He was not able to provide further history.  PAST MEDICAL HISTORY :  He  has a past medical history of History of colon polyps (2012); History of kidney stones; Osteoarthritis; Brachial neuritis; GI bleed; Biceps tendon rupture (2015); Malignant melanoma of skin of scalp ([place]) (2011); PAF (paroxysmal atrial fibrillation) ([place]); Carotid arterial disease ([place]); Degenerative joint disease; H/O cardiac catheterization; Essential hypertension; Diet-controlled diabetes mellitus ([place]); Diabetes mellitus without complication ([place]); and Colonic mass.  PAST SURGICAL HISTORY: He  has past surgical history that includes Total hip arthroplasty (Left, 2010); melanoma on head (2011); Lumbar laminectomy (2006); Hand surgery (2012); Carpal tunnel release (10/23/2011); Lateral fusion lumbar spine, thoracotomy; Melanoma excision (Left, 11/2011); Cataract extraction, bilateral (Bilateral, 2013); Nerve Surgery (Left, 2011); Ulnar nerve repair (2010); Total knee arthroplasty (Left, 09/27/2015); Joint replacement (Left); and Colonoscopy with propofol (N/A, 12/06/2015).  Allergies  Allergen Reactions  . Digoxin Other (See Comments)    Pt states that this medication caused his heart to stop.      No current facility-administered medications on file prior to encounter.   Current Outpatient Prescriptions on File Prior to  Encounter  Medication Sig  . allopurinol (ZYLOPRIM) 100 MG tablet Take 200 mg by mouth daily.   Marland Kitchen aspirin 81 MG EC tablet Take 81 mg by mouth daily.    Marland Kitchen atorvastatin (LIPITOR) 20 MG tablet Take 20 mg by mouth daily.   Marland Kitchen diltiazem (CARDIZEM) 60 MG tablet Take 1 tablet (60 mg total) by mouth 3 (three) times daily.  Marland Kitchen docusate sodium (COLACE) 100 MG capsule Take 1 capsule (100 mg total) by mouth 2 (two) times daily.  . feeding supplement, ENSURE ENLIVE, (ENSURE ENLIVE) LIQD Take 237 mLs by mouth 2 (two) times daily between meals.  . finasteride (PROSCAR) 5 MG tablet Take 5 mg by mouth daily.    . fluticasone (FLONASE) 50 MCG/ACT nasal spray Place 2 sprays into both nostrils daily.  . furosemide (LASIX) 20 MG tablet Take 20 mg by mouth daily.  Marland Kitchen gabapentin (NEURONTIN) 600 MG tablet Take 600 mg by mouth 4 (four) times daily.  Marland Kitchen HYDROmorphone (DILAUDID) 2 MG tablet Take 1 tablet (2 mg total) by mouth every 6 (six) hours as needed for severe pain.  Marland Kitchen lansoprazole (PREVACID 24HR) 15 MG capsule Take 15 mg by mouth daily.  . megestrol (MEGACE) 400 MG/10ML suspension Take 10 mLs (400 mg total) by mouth daily.  . Omega 3 340 MG CPDR Take 340 mg by mouth daily.  . Tamsulosin HCl (FLOMAX) 0.4 MG CAPS Take 0.4 mg by mouth daily.   . traMADol (ULTRAM) 50 MG tablet Take 1 tablet (50 mg total) by mouth every 6 (six) hours as needed for moderate pain (mild to moderate pain).    FAMILY HISTORY:  His indicated that his mother is deceased. He indicated that his father is deceased. He indicated that his sister is alive. He indicated that two of his  five brothers are alive.   SOCIAL HISTORY: He  reports that he quit smoking about 27 years ago. His smoking use included Cigarettes. He has a 165 pack-year smoking history. He does not have any smokeless tobacco history on file. He reports that he does not drink alcohol or use illicit drugs.  REVIEW OF SYSTEMS:   Level 5 caveat  SUBJECTIVE:    VITAL SIGNS: BP  95/44 mmHg  Pulse 99  Temp(Src) 99.1 F (37.3 C) (Axillary)  Resp 30  Ht 5\' 5"  (1.651 m)  Wt 168 lb 12.8 oz (76.567 kg)  BMI 28.09 kg/m2  SpO2 92%  HEMODYNAMICS:    VENTILATOR SETTINGS: Vent Mode:  [-] PRVC FiO2 (%):  [100 %] 100 % Set Rate:  [20 bmp] 20 bmp Vt Set:  [600 mL] 600 mL PEEP:  [5 cmH20-8 cmH20] 8 cmH20  INTAKE / OUTPUT: I/O last 3 completed shifts: In: 1121 [I.V.:1121] Out: 22 [Urine:22]  PHYSICAL EXAMINATION: General:  Somnolent, responds to tactile stimuli only, tachypneic on BiPAP Neuro:  PERRL, MAEs, DTRs symmetric HEENT:  NCAT Cardiovascular: Tachy, regular, no M noted Lungs: BS full, bronchial BS in RLL, no wheezes Abdomen: distended, tympanitic, diminished BS Ext: 3-4+ symmetric LE edema Skin: no lesions noted  LABS:  BMET  Recent Labs Lab 12/07/15 0443 11/20/2015 2302 12/13/15 0556  NA 146* 140 142  K 4.3 4.0 4.4  CL 114* 106 108  CO2 24 24 28   BUN 55* 52* 54*  CREATININE 1.73* 1.75* 1.90*  GLUCOSE 77 98 96    Electrolytes  Recent Labs Lab 12/07/15 0443 12/08/2015 2302 12/13/15 0556  CALCIUM 11.9* 13.6* 13.8*    CBC  Recent Labs Lab 11/15/2015 2302 12/13/15 0556 12/13/15 1151  WBC 9.4 7.9 3.8  HGB 9.8* 10.7* 9.6*  HCT 30.2* 33.4* 30.1*  PLT 223 257 192    Coag's  Recent Labs Lab 12/07/15 0443 12/13/15 0556  APTT 31 24  INR 1.27 1.18    Sepsis Markers  Recent Labs Lab 12/13/15 1151 12/13/15 1617  LATICACIDVEN 2.0 2.8*    ABG  Recent Labs Lab 12/13/15 0940 12/13/15 1955 12/13/15 2152  PHART 7.50* 7.56* 7.37  PCO2ART 36 21* 32  PO2ART 65* 64* 48*    Liver Enzymes  Recent Labs Lab 12/01/2015 2302  AST 20  ALT 15*  ALKPHOS 67  BILITOT 1.1  ALBUMIN 2.5*    Cardiac Enzymes No results for input(s): TROPONINI, PROBNP in the last 168 hours.  Glucose  Recent Labs Lab 12/13/15 1701  GLUCAP 104*    Imaging US Venous Img Lower Bilateral  12/13/2015  CLINICAL DATA:  Bilateral lower  extremity pain and swelling for 1 week. History of melanoma and left total knee replacement -09/27/2015 Evaluate for DVT. EXAM: BILATERAL LOWER EXTREMITY VENOUS DOPPLER ULTRASOUND TECHNIQUE: Gray-scale sonography with graded compression, as well as color Doppler and duplex ultrasound were performed to evaluate the lower extremity deep venous systems from the level of the common femoral vein and including the common femoral, femoral, profunda femoral, popliteal and calf veins including the posterior tibial, peroneal and gastrocnemius veins when visible. The superficial great saphenous vein was also interrogated. Spectral Doppler was utilized to evaluate flow at rest and with distal augmentation maneuvers in the common femoral, femoral and popliteal veins. COMPARISON:  Left knee radiographs - 09/27/2015). FINDINGS: RIGHT LOWER EXTREMITY Common Femoral Vein: No evidence of thrombus. Normal compressibility, respiratory phasicity and response to augmentation. Saphenofemoral Junction: No evidence of thrombus. Normal compressibility and flow on  color Doppler imaging. Profunda Femoral Vein: No evidence of thrombus. Normal compressibility and flow on color Doppler imaging. Femoral Vein: No evidence of thrombus. Normal compressibility, respiratory phasicity and response to augmentation. Popliteal Vein: No evidence of thrombus. Normal compressibility, respiratory phasicity and response to augmentation. Calf Veins: No evidence of thrombus. Normal compressibility and flow on color Doppler imaging. Superficial Great Saphenous Vein: No evidence of thrombus. Normal compressibility and flow on color Doppler imaging. Venous Reflux:  None. Other Findings:  None. LEFT LOWER EXTREMITY Common Femoral Vein: No evidence of thrombus. Normal compressibility, respiratory phasicity and response to augmentation. Saphenofemoral Junction: No evidence of thrombus. Normal compressibility and flow on color Doppler imaging. Profunda Femoral Vein: No  evidence of thrombus. Normal compressibility and flow on color Doppler imaging. Femoral Vein: No evidence of thrombus. Normal compressibility, respiratory phasicity and response to augmentation. Popliteal Vein: No evidence of thrombus. Normal compressibility, respiratory phasicity and response to augmentation. Calf Veins: No evidence of thrombus. Normal compressibility and flow on color Doppler imaging. Superficial Great Saphenous Vein: No evidence of thrombus. Normal compressibility and flow on color Doppler imaging. Venous Reflux:  None. Other Findings:  None. IMPRESSION: No evidence of DVT within either lower extremity. Electronically Signed   By: Sandi Mariscal M.D.   On: 12/13/2015 15:10   Portable Chest Xray  12/13/2015  CLINICAL DATA:  Respiratory distress. Status post intubation and central line placement. EXAM: PORTABLE CHEST 1 VIEW COMPARISON:  12/13/2015 FINDINGS: The ET tube tip is above the carina. Right subclavian catheter tip is in the projection of the SVC. No pneumothorax identified. Heart size is normal. There is atelectasis in the left base. Right pleural effusion is identified and there is decreased aeration to the right midlung and right base. IMPRESSION: 1. Satisfactory position of right subclavian central venous catheter and ET tube. 2. No change in aeration right lung compared with prior exam. Electronically Signed   By: Kerby Moors M.D.   On: 12/13/2015 21:23   Dg Chest Port 1 View  12/13/2015  CLINICAL DATA:  Acute respiratory failure and hypoxemia EXAM: PORTABLE CHEST 1 VIEW COMPARISON:  12/13/2015 FINDINGS: Normal heart size. There is a right plural effusion which has increased in volume from previous exam. There has been worsening airspace opacification within the right midlung and right base. IMPRESSION: 1. Worsening aeration to the right lung base. 2. Suspect increase in volume of right effusion. Electronically Signed   By: Kerby Moors M.D.   On: 12/13/2015 19:51   Dg Chest  Port 1 View  12/13/2015  CLINICAL DATA:  75 year old male with hypertension status post biopsy of abdominal mass. Fever. Initial encounter. EXAM: PORTABLE CHEST 1 VIEW COMPARISON:  CT Abdomen and Pelvis 12/07/2015 and earlier. FINDINGS: Portable AP semi upright view at 1106 hours. New confluent airspace disease at the right lung base. No definite pleural effusion. Somewhat low lung volumes. Normal cardiac size and mediastinal contours. Visualized tracheal air column is within normal limits. No pneumothorax or pulmonary edema. Partially visible cervical ACDF hardware. IMPRESSION: Acute right lung base pneumonia.  No definite pleural effusion. Electronically Signed   By: Genevie Ann M.D.   On: 12/13/2015 11:37     STUDIES:  01/23 CTAP: Markedly abnormal study with extensive peritoneal/ omental/ mesenteric nodularity. Bulky retroperitoneal adenopathy with a large conglomerate mass in the left lower quadrant intimately associated with the descending colon and proximal sigmoid colon. Differential considerations would include metastatic disease (possible colon primary? ) with peritoneal spread or lymphoma 02/01 LE venous  US: No DVT  MICRO: Blood 02/01 >>  Resp 02/01 >>  Legionella Ag 02/01 >>  Strep Ag 02/01 >>   ANTIBIOTICS: Vanc 02/01 >>  Pip-tazo 02/01 >>  Azithro 02/01 >>   SIGNIFICANT EVENTS:   LINES/TUBES: ETT 02/01 >>  R [place] CVL 02/01 >>    ASSESSMENT / PLAN:  PULMONARY A: Acute hypoxic respiratory failure RLL opacity c/w PNA and/or effusion P:   Vent settings established Vent bundle implemented Daily SBT as indicated  CARDIOVASCULAR A:  Hypotension, shock - suspect septic Sinus tachycardia H/O PAF P:  MAP goal > 65 mmHg Norepinephrine to achieve BP goal  RENAL A:   Metabolic acidosis Severe hypercalcemia likely due to malignancy AKI, oliguric Very poor candidate for HD P:   Monitor BMET intermittently Monitor I/Os Correct electrolytes as  indicated  GASTROINTESTINAL A:   Abdominal distention  P:   SUP: IV PPI  OGT to LIS Consider nutrition in next day or two  HEMATOLOGIC A:   Intra-abdominal mass with extensive peritoneal metastases P:  DVT px: enoxaparin Monitor CBC intermittently Transfuse per usual ICU guidelines Oncology involved. Awaiting final path interpretation  INFECTIOUS A:   Severe sepsis Suspect RLL HCAP P:   Monitor temp, WBC count Micro and abx as above   ENDOCRINE A:   No issues P:   Monitor glu on chem panels Consider SSI if glu > 180   NEUROLOGIC A:   Acute encephalopathy due to hypercalcemia ICU/vent associated discomfort P:   RASS goal: -2, -3 PAD protocol   FAMILY  - Updates: Family updated this evening. We discussed prognosis and I offered guidance re: how to proceed depending on final biopsy results  CCM time: 45 mins The above time includes time spent in consultation with patient and/or family members and reviewing care plan on multidisciplinary rounds  Merton Border, MD PCCM service Mobile [phone] Pager [phone]     12/13/2015, 11:45 PM

## 2015-12-13 NOTE — Progress Notes (Signed)
Reported to Dr Bridgett Larsson that patients temp had increased to 102.7 and patient was unresponsive

## 2015-12-13 NOTE — Progress Notes (Signed)
ABG called to [place]. Orders are to increase peep to 10 if BP is acceptable. Verified current BP which remains hypotensive at this time. Verified is ok to increase peep to 8. Plan to increase to 10 once BP is better.

## 2015-12-13 NOTE — Progress Notes (Signed)
eLink Physician-Brief Progress Note Patient Name: Bob Christensen DOB: Apr 17, 1941 MRN: [id number]   Date of Service  12/13/2015  HPI/Events of Note  Hypotensive, lactate rising Hypercalcemia of malignancy - biopsy results pending  eICU Interventions  1L NS bolus     Intervention Category Intermediate Interventions: Hypotension - evaluation and management  ALVA,RAKESH V. 12/13/2015, 5:36 PM

## 2015-12-13 NOTE — Procedures (Signed)
PROCEDURE NOTE: CVL PLACEMENT  INDICATION:    Shock  CONSENT:   Risks of procedure as well as the alternatives were explained to the patient or surrogate. Consent for procedure obtained. A time out was performed.   PROCEDURE  Sterile technique was used including antiseptics, cap, gloves, gown, hand hygiene, mask and full body sheet.  Skin prep: Chlorhexidine; local anesthetic administered  A triple lumen catheter was placed in the R [place] vein using the Seldinger technique.    EVALUATION:  Blood flow good  Complications: No apparent complications  Patient tolerated the procedure well.  Chest X-ray ordered to verify placement and is pending   Merton Border, MD PCCM service Mobile [phone]

## 2015-12-13 NOTE — Progress Notes (Addendum)
ANTIBIOTIC CONSULT NOTE - INITIAL  Pharmacy Consult for Vancomycin/Zosyn Indication: sepsis  Allergies  Allergen Reactions  . Digoxin Other (See Comments)    Pt states that this medication caused his heart to stop.      Patient Measurements: Height: 5\' 8"  (172.7 cm) Weight: 171 lb 1.6 oz (77.61 kg) IBW/kg (Calculated) : 68.4  Vital Signs: Temp: 100 F (37.8 C) (02/01 1045) Temp Source: Oral (02/01 1045) BP: 96/47 mmHg (02/01 1045) Pulse Rate: 93 (02/01 1045) Intake/Output from previous day: 01/31 0701 - 02/01 0700 In: 1021 [I.V.:1021] Out: 22 [Urine:22] Intake/Output from this shift:    Labs:  Recent Labs  12/05/2015 2302 12/13/15 0556 12/13/15 1151  WBC 9.4 7.9 3.8  HGB 9.8* 10.7* 9.6*  PLT 223 257 192  CREATININE 1.75* 1.90*  --    Estimated Creatinine Clearance: 33 mL/min (by C-G formula based on Cr of 1.9). No results for input(s): VANCOTROUGH, VANCOPEAK, VANCORANDOM, GENTTROUGH, GENTPEAK, GENTRANDOM, TOBRATROUGH, TOBRAPEAK, TOBRARND, AMIKACINPEAK, AMIKACINTROU, AMIKACIN in the last 72 hours.   Microbiology: Recent Results (from the past 720 hour(s))  MRSA PCR Screening     Status: None   Collection Time: 11/27/2015  8:55 PM  Result Value Ref Range Status   MRSA by PCR NEGATIVE NEGATIVE Final    Comment:        The GeneXpert MRSA Assay (FDA approved for NASAL specimens only), is one component of a comprehensive MRSA colonization surveillance program. It is not intended to diagnose MRSA infection nor to guide or monitor treatment for MRSA infections.     Medical History: Past Medical History  Diagnosis Date  . History of colon polyps 2012    a. Tubular adenoma excised by Tiffany Kocher  . History of kidney stones   . Osteoarthritis   . Brachial neuritis   . GI bleed     a. Following polypectomy  . Biceps tendon rupture 2015  . Malignant melanoma of skin of scalp ([place]) 2011    a. Excised 2001 [facility] [place] Thereasa Solo)  . PAF (paroxysmal atrial  fibrillation) ([place])     a. 1997 - no known recurrence since;  b. CHA2DS2VASc = 4.  . Carotid arterial disease ([place])     a. 07/2014 Carotid U/S: bilat 0-39% stenosis.  . Degenerative joint disease     a. 2010 s/p L THA;  b. 2016 - pending L knee arthroplasty.  . H/O cardiac catheterization     a. 1997 - reportedly normal.  . Essential hypertension   . Diet-controlled diabetes mellitus ([place])   . Diabetes mellitus without complication ([place])   . Colonic mass     Medications:  Scheduled:  . allopurinol  200 mg Oral [id number]  . atorvastatin  20 mg Oral q morning - 10a  . calcitonin  400 Units Subcutaneous Q12H  . diltiazem  60 mg Oral TID  . docusate sodium  100 mg Oral BID  . enoxaparin (LOVENOX) injection  40 mg Subcutaneous Q24H  . feeding supplement (ENSURE ENLIVE)  237 mL Oral BID BM  . finasteride  5 mg Oral Daily  . fluticasone  2 spray Each Nare Daily  . gabapentin  600 mg Oral QID  . megestrol  400 mg Oral Daily  . multivitamin with minerals  1 tablet Oral Daily  . pantoprazole  40 mg Oral Daily  . piperacillin-tazobactam (ZOSYN)  IV  3.375 g Intravenous 3 times per day  . sodium chloride flush  3 mL Intravenous Q12H  . tamsulosin  0.4 mg  Oral q morning - 10a  . vancomycin  1,000 mg Intravenous Once  . vancomycin  1,000 mg Intravenous Q24H   Infusions:  . sodium chloride 75 mL/hr at 12/13/15 [id number]   Assessment: 75 y/o M admitted with hypercalcemia ordered empiric abx for r/o sepsis.   Ke: 0.033 Vd: 54.3 Goal of Therapy:  Vancomycin trough level 15-20 mcg/ml  Plan:  Zosyn 3.375 g Ei q 8 hours.   Vancomycin 1000 mg iv q 24 hours with stacked dosing and a trough with the 5th total dose.   Will continue to follow renal function and culture results.   Ulice Dash D 12/13/2015,1:11 PM

## 2015-12-13 NOTE — Progress Notes (Addendum)
[place] at [place] NAME: Bob Christensen    MR#:  [id number]  DATE OF BIRTH:  1941/04/10  SUBJECTIVE:  CHIEF COMPLAINT:  No chief complaint on file.  Worsening confusion and lethargy. Unresponsive to stimuli, fever 102.7 just now. REVIEW OF SYSTEMS:  Unable to get ROS.  DRUG ALLERGIES:   Allergies  Allergen Reactions  . Digoxin Other (See Comments)    Pt states that this medication caused his heart to stop.      VITALS:  Blood pressure 96/47, pulse 93, temperature 100 F (37.8 C), temperature source Oral, resp. rate 21, height 5' 8" (1.727 m), weight 77.61 kg (171 lb 1.6 oz), SpO2 94 %.  PHYSICAL EXAMINATION:  GENERAL:  75 y.o.-year-old patient lying in the bed, unresponsive to stimuli.  EYES: Pupils equal, round, reactive to light and accommodation. No scleral icterus.  HEENT: Head atraumatic, normocephalic.  NECK:  Supple, no jugular venous distention. No thyroid enlargement, no tenderness.  LUNGS: Normal breath sounds bilaterally, no wheezing, has crackles on right side. No use of accessory muscles of respiration.  CARDIOVASCULAR: S1, S2 normal. No murmurs, rubs, or gallops.  ABDOMEN: Soft, nontender, distended. Bowel sounds present.  EXTREMITIES: No pedal edema, cyanosis, or clubbing.  NEUROLOGIC: unable to exam. PSYCHIATRIC: The patient is  unresponsive to stimuli.  SKIN: No obvious rash, lesion, or ulcer.    LABORATORY PANEL:   CBC  Recent Labs Lab 12/13/15 0556  WBC 7.9  HGB 10.7*  HCT 33.4*  PLT 257   ------------------------------------------------------------------------------------------------------------------  Chemistries   Recent Labs Lab 11/30/2015 2302 12/13/15 0556  NA 140 142  K 4.0 4.4  CL 106 108  CO2 24 28  GLUCOSE 98 96  BUN 52* 54*  CREATININE 1.75* 1.90*  CALCIUM 13.6* 13.8*  AST 20  --   ALT 15*  --   ALKPHOS 67  --   BILITOT 1.1  --     ------------------------------------------------------------------------------------------------------------------  Cardiac Enzymes No results for input(s): TROPONINI in the last 168 hours. ------------------------------------------------------------------------------------------------------------------  RADIOLOGY:  No results found.  EKG:   Orders placed or performed during the hospital encounter of 12/04/15  . EKG 12-Lead  . EKG 12-Lead  . ED EKG  . ED EKG  . EKG    ASSESSMENT AND PLAN:   #1 Hypercalcemia- likely from cancer and ARF. Ca is still high, calculated level is 15 mg/dL. Calcitonin per Dr. Grayland Ormond, continue NS iv.  Treated with 1 dose zometa last night.  #2 Possible Lymphoma-intra-abdominal mass found last week, status post biopsy results pending. -Possible lymphoma.  -Scheduled for bone marrow biopsy this hospitalization- per oncology -Abdominal distention-3 L ascites fluid drawn last week, Nothing by mouth for possible IR paracentesis.  All above are canceled due to AMS and fever.  #3 paroxysmal atrial fibrillation-Cardizem 3 times a day. -No anticoagulation due to need for biopsies  #4 BPH-continue home medications. -On Flomax and Proscar  #5 lower extremity edema-could be from the intra-abdominal mass compressing on veins and impending the return. -Ted's stockings, keep the legs elevated -Check Dopplers to rule out DVT  Acute metabolic encephalopathy, due to hypercalcemia/possible sepsis.  Precaution. NPO.  Hypotension,septic shock,  NS bolus, continue NS IV.  Sepsis.  Blood culture, CBC, lactic acid, UA, CXR.  vanco and zosyn PTD.  Acute right lung base pneumonia. ( per CXR today) Started vanco and zosyn. F/u cultures. NEB prn.   I discussed with Dr. Grayland Ormond. High risk of cardiopulmonary arrest. Poor  prognosis. Palliative care consult. All the records are reviewed and case discussed with Care Management/Social Workerr. Management  plans discussed with the patient, daughter, son and sister,  and they are in agreement.  CODE STATUS: full code  TOTAL CRITICAL TIME TAKING CARE OF THIS PATIENT: 56 minutes.  Greater than 50% time was spent on coordination of care and face-to-face counseling.  POSSIBLE D/C IN >3 DAYS, DEPENDING ON CLINICAL CONDITION.   Demetrios Loll M.D on 12/13/2015 at 11:21 AM  Between 7am to 6pm - Pager - [phone]  After 6pm go to [email or web address] - password [email or web address] [place] [facility]  Office  [phone]  CC: Primary care physician; Lelon Huh, MD

## 2015-12-13 DEATH — deceased

## 2015-12-14 ENCOUNTER — Inpatient Hospital Stay: Payer: Medicare Other

## 2015-12-14 LAB — CBC
HEMATOCRIT: 33.2 % — AB (ref 40.0–52.0)
Hemoglobin: 10.4 g/dL — ABNORMAL LOW (ref 13.0–18.0)
MCH: 27.7 pg (ref 26.0–34.0)
MCHC: 31.4 g/dL — ABNORMAL LOW (ref 32.0–36.0)
MCV: 88.5 fL (ref 80.0–100.0)
PLATELETS: 259 10*3/uL (ref 150–440)
RBC: 3.75 MIL/uL — AB (ref 4.40–5.90)
RDW: 17.8 % — AB (ref 11.5–14.5)
WBC: 11.5 10*3/uL — AB (ref 3.8–10.6)

## 2015-12-14 LAB — BASIC METABOLIC PANEL
ANION GAP: 9 (ref 5–15)
BUN: 74 mg/dL — ABNORMAL HIGH (ref 6–20)
CHLORIDE: 109 mmol/L (ref 101–111)
CO2: 23 mmol/L (ref 22–32)
Calcium: 11.7 mg/dL — ABNORMAL HIGH (ref 8.9–10.3)
Creatinine, Ser: 3.16 mg/dL — ABNORMAL HIGH (ref 0.61–1.24)
GFR, EST AFRICAN AMERICAN: 21 mL/min — AB (ref >60)
GFR, EST NON AFRICAN AMERICAN: 18 mL/min — AB (ref >60)
Glucose, Bld: 85 mg/dL (ref 65–99)
POTASSIUM: 5.1 mmol/L (ref 3.5–5.1)
SODIUM: 141 mmol/L (ref 135–145)

## 2015-12-14 LAB — BLOOD GAS, ARTERIAL
Acid-Base Excess: 4.7 mmol/L — ABNORMAL HIGH (ref 0.0–3.0)
Allens test (pass/fail): POSITIVE — AB
Bicarbonate: 28.1 mEq/L — ABNORMAL HIGH (ref 21.0–28.0)
FIO2: 0.32
O2 Saturation: 94.2 %
PH ART: 7.5 — AB (ref 7.350–7.450)
Patient temperature: 37
pCO2 arterial: 36 mmHg (ref 32.0–48.0)
pO2, Arterial: 65 mmHg — ABNORMAL LOW (ref 83.0–108.0)

## 2015-12-14 LAB — GLUCOSE, CAPILLARY
GLUCOSE-CAPILLARY: 82 mg/dL (ref 65–99)
GLUCOSE-CAPILLARY: 81 mg/dL (ref 65–99)
GLUCOSE-CAPILLARY: 12 mg/dL — AB (ref 65–99)
Glucose-Capillary: 25 mg/dL — CL (ref 65–99)
Glucose-Capillary: 38 mg/dL — CL (ref 65–99)
Glucose-Capillary: 110 mg/dL — ABNORMAL HIGH (ref 65–99)
Glucose-Capillary: 45 mg/dL — ABNORMAL LOW (ref 65–99)

## 2015-12-14 LAB — URINALYSIS COMPLETE WITH MICROSCOPIC (ARMC ONLY)
BILIRUBIN URINE: NEGATIVE
GLUCOSE, UA: NEGATIVE mg/dL
Ketones, ur: NEGATIVE mg/dL
NITRITE: NEGATIVE
Protein, ur: 100 mg/dL — AB
Specific Gravity, Urine: 1.019 (ref 1.005–1.030)
Squamous Epithelial / LPF: NONE SEEN
pH: 5 (ref 5.0–8.0)

## 2015-12-14 LAB — STREP PNEUMONIAE URINARY ANTIGEN: Strep Pneumo Urinary Antigen: NEGATIVE

## 2015-12-14 MED ORDER — SODIUM CHLORIDE 0.9 % IV BOLUS (SEPSIS)
1000.0000 mL | Freq: Once | INTRAVENOUS | Status: AC
  Administered 2015-12-14: 1000 mL via INTRAVENOUS

## 2015-12-14 MED ORDER — ENOXAPARIN SODIUM 30 MG/0.3ML ~~LOC~~ SOLN
30.0000 mg | SUBCUTANEOUS | Status: DC
  Administered 2015-12-14: 30 mg via SUBCUTANEOUS
  Filled 2015-12-14: qty 0.3

## 2015-12-14 MED ORDER — SODIUM CHLORIDE 0.9 % IV BOLUS (SEPSIS)
500.0000 mL | Freq: Once | INTRAVENOUS | Status: AC
  Administered 2015-12-14: 19:00:00 via INTRAVENOUS

## 2015-12-14 MED ORDER — PIPERACILLIN-TAZOBACTAM 3.375 G IVPB
3.3750 g | Freq: Two times a day (BID) | INTRAVENOUS | Status: DC
  Filled 2015-12-14 (×2): qty 50

## 2015-12-14 MED ORDER — DEXTROSE 50 % IV SOLN
INTRAVENOUS | Status: AC
  Administered 2015-12-14: 50 mL
  Filled 2015-12-14: qty 50

## 2015-12-14 MED ORDER — DEXTROSE-NACL 5-0.45 % IV SOLN
INTRAVENOUS | Status: DC
Start: 2015-12-14 — End: 2015-12-15
  Administered 2015-12-14: 14:00:00 via INTRAVENOUS

## 2015-12-14 MED ORDER — ETOMIDATE 2 MG/ML IV SOLN
20.0000 mg | Freq: Once | INTRAVENOUS | Status: AC
  Administered 2015-12-14: 20 mg via INTRAVENOUS
  Filled 2015-12-14: qty 10

## 2015-12-14 MED ORDER — DEXTROSE 50 % IV SOLN
50.0000 mL | Freq: Once | INTRAVENOUS | Status: AC
  Administered 2015-12-14 (×2): 50 mL via INTRAVENOUS

## 2015-12-14 MED ORDER — DEXTROSE 50 % IV SOLN
INTRAVENOUS | Status: AC
  Administered 2015-12-14: 50 mL via INTRAVENOUS
  Filled 2015-12-14: qty 50

## 2015-12-14 MED ORDER — INSULIN ASPART 100 UNIT/ML ~~LOC~~ SOLN
0.0000 [IU] | SUBCUTANEOUS | Status: DC
Start: 2015-12-14 — End: 2015-12-15

## 2015-12-15 LAB — CULTURE, RESPIRATORY W GRAM STAIN

## 2015-12-15 LAB — CULTURE, RESPIRATORY

## 2015-12-18 ENCOUNTER — Telehealth: Payer: Self-pay | Admitting: *Deleted

## 2015-12-18 ENCOUNTER — Telehealth: Payer: Self-pay | Admitting: Family Medicine

## 2015-12-18 LAB — CYTOLOGY - NON PAP

## 2015-12-18 LAB — SURGICAL PATHOLOGY

## 2015-12-18 NOTE — Telephone Encounter (Signed)
Diffuse Large B Cell Lymphoma, non-germinal center subtype.

## 2015-12-18 NOTE — Telephone Encounter (Signed)
Needs to know what type of cancer he had even though he passed away 2023-03-07. Please call with results. I called [facility] and they said consult came back today and that Dr Luana Shu has it, will have to to release it in the system

## 2015-12-18 NOTE — Telephone Encounter (Signed)
I called Hoyle Sauer back and lether know the diagnosis and she was thankful to know what it wa

## 2015-12-18 NOTE — Telephone Encounter (Signed)
Pt daughter, Hoyle Sauer called to request pt labs from his last visit.  Pt is deceased as of 01/07/2016.  Pt was at [facility] when he past away.  [id number]

## 2015-12-19 LAB — CULTURE, BLOOD (ROUTINE X 2)
Culture: NO GROWTH
Culture: NO GROWTH

## 2015-12-19 NOTE — Telephone Encounter (Signed)
Last labs we had done were 08-17-2015. I don't know what the policy is for releasing records after a patient's death.

## 2015-12-19 NOTE — Telephone Encounter (Signed)
Spoke with Hoyle Sauer and explained our policy and she understands will be bringing the necessary documentation to retreive records. KB

## 2015-12-19 NOTE — Telephone Encounter (Signed)
Please review. Thanks!  

## 2015-12-20 LAB — MISC LABCORP TEST (SEND OUT)
LabCorp test name: 8856
Labcorp test code: 9985

## 2016-01-10 NOTE — Progress Notes (Signed)
Notifed [facility] RN about patient's BP systolic [id number] and MAP 63- orders per Dr. Juanell Fairly to not titrate up on levo or vasopressin drip.  I gave one liter bolus earlier today- lung sounds are clear/diminished- updated family and would like to know if I could give another bolus- waiting on call back from [facility] MD.

## 2016-01-10 NOTE — Progress Notes (Signed)
Pharmacy Antibiotic Note  Bob Christensen is a 75 y.o. male admitted on 11/30/2015 with pneumonia and sepsis.  Pharmacy has been consulted for vancomycin and Zosyn dosing.  Plan: After discussion with Dr. Ashby Dawes, antibiotics will be changed to Zosyn only due to negative MRSA PCR. Will decrease Zosyn to 3.375 g EI q 8 hours due to increasing SCr with CrCl < 20 ml/min.   Height: 5\' 5"  (165.1 cm) Weight: 168 lb 12.8 oz (76.567 kg) IBW/kg (Calculated) : 61.5  Temp (24hrs), Avg:100.1 F (37.8 C), Min:98.5 F (36.9 C), Max:103 F (39.4 C)   Recent Labs Lab 11/21/2015 2302 12/13/15 0556 12/13/15 1151 12/13/15 1617 23-Dec-2015 0521  WBC 9.4 7.9 3.8  --  11.5*  CREATININE 1.75* 1.90*  --   --  3.16*  LATICACIDVEN  --   --  2.0 2.8*  --     Estimated Creatinine Clearance: 19.6 mL/min (by C-G formula based on Cr of 3.16).    Allergies  Allergen Reactions  . Digoxin Other (See Comments)    Pt states that this medication caused his heart to stop.      Antimicrobials this admission: Vancomycin 02/01 >> 02/02 Azithromycin  02/01 >>  Zosyn 02/01 >>  Dose adjustments this admission:   Microbiology results: 02/01 BCx: negative x 2 02/01 Sputum: GNR  01/31 MRSA PCR: negative  Thank you for allowing pharmacy to be a part of this patient's care.  Ulice Dash D 12/23/15 2:28 PM

## 2016-01-10 NOTE — Progress Notes (Signed)
[facility] Pharmacist-Brief Progress Note Patient Name: Bob Christensen DOB: [birth year] MRN: [id number]   Date of Service  25-Dec-2015  HPI/Events of Note  75 y/o M w/ increase in SCr 1.9 to 3.16. CrCl < 30 mL/min  eICU Interventions  Adjust dose of Lovenox to 30mg  q24h    Myrene Galas, PharmD Dec 25, 2015, 2:27 PM

## 2016-01-10 NOTE — Progress Notes (Signed)
[place] [facility] [place] NAME: Bob Christensen    MR#:  [id number]  DATE OF BIRTH:  1941/03/18  SUBJECTIVE:  CHIEF COMPLAINT:  No chief complaint on file.  - Patient admitted from oncology office today days ago for hypercalcemia. Patient became hypotensive, febrile yesterday-transfer to ICU and now intubated and on ventilator. -He is on 2 pressors. Blood pressure still borderline and low. -Remains on PEEP of 10 and 65% FiO2 this morning. Family at bedside.  REVIEW OF SYSTEMS:  Review of Systems  Unable to perform ROS: critical illness    DRUG ALLERGIES:   Allergies  Allergen Reactions  . Digoxin Other (See Comments)    Pt states that this medication caused his heart to stop.      VITALS:  Blood pressure 83/56, pulse 88, temperature 98.8 F (37.1 C), temperature source Axillary, resp. rate 26, height '5\' 5"'$  (1.651 m), weight 76.567 kg (168 lb 12.8 oz), SpO2 100 %.  PHYSICAL EXAMINATION:  Physical Exam  GENERAL:  75 y.o.-year-old patient lying in the bed with no acute distress.  EYES: Pupils equal, round, reactive to light and accommodation. No scleral icterus. Extraocular muscles intact.  HEENT: Head atraumatic, normocephalic. Oropharynx and nasopharynx clear. Intubated and also has an OG tube NECK:  Supple, no jugular venous distention. No thyroid enlargement, no tenderness.  LUNGS: Shallow breath sounds on the vent, bibasilar rhonchi and scattered wheezing heard. No crepitations. No use of accessory muscles for breathing. CARDIOVASCULAR: S1, S2 normal. No rubs, or gallops. 3/6 systolic murmur is present ABDOMEN: Soft, nontender,distended. Bowel sounds present. No organomegaly or mass.  EXTREMITIES: No  cyanosis, or clubbing. 2+ pedal edema noted NEUROLOGIC: Unable to do a neuro exam due to patient being on vent and also sedated. PSYCHIATRIC: The patient is sedated on the ventilator SKIN: No obvious rash, lesion, or ulcer.     LABORATORY PANEL:   CBC  Recent Labs Lab 01/04/2016 0521  WBC 11.5*  HGB 10.4*  HCT 33.2*  PLT 259   ------------------------------------------------------------------------------------------------------------------  Chemistries   Recent Labs Lab 11/18/2015 2302  01-04-16 0521  NA 140  < > 141  K 4.0  < > 5.1  CL 106  < > 109  CO2 24  < > 23  GLUCOSE 98  < > 85  BUN 52*  < > 74*  CREATININE 1.75*  < > 3.16*  CALCIUM 13.6*  < > 11.7*  AST 20  --   --   ALT 15*  --   --   ALKPHOS 67  --   --   BILITOT 1.1  --   --   < > = values in this interval not displayed. ------------------------------------------------------------------------------------------------------------------  Cardiac Enzymes No results for input(s): TROPONINI in the last 168 hours. ------------------------------------------------------------------------------------------------------------------  RADIOLOGY:  Dg Abd 1 View  01-04-16  CLINICAL DATA:  Respiratory failure assess NG tube placement EXAM: ABDOMEN - 1 VIEW COMPARISON:  Chest x-ray dated 04-Jan-2016 FINDINGS: A tubular structures demonstrated assumed to reflect the nasogastric tube with the proximal port in the proximal gastric fundus. The distal port is not clearly visible. The visualized portions a of the abdomen reveal a paucity of bowel gas. There is calcification which may lie in the left kidney. There are degenerative changes of the lumbar spine. There is infiltrate in the right lung base. IMPRESSION: The nasogastric tube tip is not clearly evident being obscured by the upper lumbar spine. The proximal port appears to  lie below the GE junction in the region of the proximal gastric fundus. Electronically Signed   By: David  Martinique M.D.   On: 2015-12-23 07:29   US Venous Img Lower Bilateral  12/13/2015  CLINICAL DATA:  Bilateral lower extremity pain and swelling for 1 week. History of melanoma and left total knee replacement -09/27/2015  Evaluate for DVT. EXAM: BILATERAL LOWER EXTREMITY VENOUS DOPPLER ULTRASOUND TECHNIQUE: Gray-scale sonography with graded compression, as well as color Doppler and duplex ultrasound were performed to evaluate the lower extremity deep venous systems from the level of the common femoral vein and including the common femoral, femoral, profunda femoral, popliteal and calf veins including the posterior tibial, peroneal and gastrocnemius veins when visible. The superficial great saphenous vein was also interrogated. Spectral Doppler was utilized to evaluate flow at rest and with distal augmentation maneuvers in the common femoral, femoral and popliteal veins. COMPARISON:  Left knee radiographs - 09/27/2015). FINDINGS: RIGHT LOWER EXTREMITY Common Femoral Vein: No evidence of thrombus. Normal compressibility, respiratory phasicity and response to augmentation. Saphenofemoral Junction: No evidence of thrombus. Normal compressibility and flow on color Doppler imaging. Profunda Femoral Vein: No evidence of thrombus. Normal compressibility and flow on color Doppler imaging. Femoral Vein: No evidence of thrombus. Normal compressibility, respiratory phasicity and response to augmentation. Popliteal Vein: No evidence of thrombus. Normal compressibility, respiratory phasicity and response to augmentation. Calf Veins: No evidence of thrombus. Normal compressibility and flow on color Doppler imaging. Superficial Great Saphenous Vein: No evidence of thrombus. Normal compressibility and flow on color Doppler imaging. Venous Reflux:  None. Other Findings:  None. LEFT LOWER EXTREMITY Common Femoral Vein: No evidence of thrombus. Normal compressibility, respiratory phasicity and response to augmentation. Saphenofemoral Junction: No evidence of thrombus. Normal compressibility and flow on color Doppler imaging. Profunda Femoral Vein: No evidence of thrombus. Normal compressibility and flow on color Doppler imaging. Femoral Vein: No  evidence of thrombus. Normal compressibility, respiratory phasicity and response to augmentation. Popliteal Vein: No evidence of thrombus. Normal compressibility, respiratory phasicity and response to augmentation. Calf Veins: No evidence of thrombus. Normal compressibility and flow on color Doppler imaging. Superficial Great Saphenous Vein: No evidence of thrombus. Normal compressibility and flow on color Doppler imaging. Venous Reflux:  None. Other Findings:  None. IMPRESSION: No evidence of DVT within either lower extremity. Electronically Signed   By: Sandi Mariscal M.D.   On: 12/13/2015 15:10   Portable Chest Xray  Dec 23, 2015  CLINICAL DATA:  Intubation. EXAM: PORTABLE CHEST 1 VIEW COMPARISON:  12/13/2015. FINDINGS: Endotracheal tube, NG tube, right PICC line in stable position. Mediastinum hilar structures are stable. Persistent dense right pulmonary infiltrate. Interim clearing of right pleural effusion. Low lung volumes with basilar atelectasis. Heart size stable. Prior cervical spine fusion. IMPRESSION: 1. Lines and tubes in stable position. 2. Interim clearing of right pleural effusion. Persistent dense right lung infiltrate without change. Electronically Signed   By: [place]   On: 12-23-2015 07:30   Portable Chest Xray  12/13/2015  CLINICAL DATA:  Respiratory distress. Status post intubation and central line placement. EXAM: PORTABLE CHEST 1 VIEW COMPARISON:  12/13/2015 FINDINGS: The ET tube tip is above the carina. Right subclavian catheter tip is in the projection of the SVC. No pneumothorax identified. Heart size is normal. There is atelectasis in the left base. Right pleural effusion is identified and there is decreased aeration to the right midlung and right base. IMPRESSION: 1. Satisfactory position of right subclavian central venous catheter and ET tube. 2. No  change in aeration right lung compared with prior exam. Electronically Signed   By: Kerby Moors M.D.   On: 12/13/2015 21:23    Dg Chest Port 1 View  12/13/2015  CLINICAL DATA:  Acute respiratory failure and hypoxemia EXAM: PORTABLE CHEST 1 VIEW COMPARISON:  12/13/2015 FINDINGS: Normal heart size. There is a right plural effusion which has increased in volume from previous exam. There has been worsening airspace opacification within the right midlung and right base. IMPRESSION: 1. Worsening aeration to the right lung base. 2. Suspect increase in volume of right effusion. Electronically Signed   By: Kerby Moors M.D.   On: 12/13/2015 19:51   Dg Chest Port 1 View  12/13/2015  CLINICAL DATA:  75 year old male with hypertension status post biopsy of abdominal mass. Fever. Initial encounter. EXAM: PORTABLE CHEST 1 VIEW COMPARISON:  CT Abdomen and Pelvis 12/07/2015 and earlier. FINDINGS: Portable AP semi upright view at 1106 hours. New confluent airspace disease at the right lung base. No definite pleural effusion. Somewhat low lung volumes. Normal cardiac size and mediastinal contours. Visualized tracheal air column is within normal limits. No pneumothorax or pulmonary edema. Partially visible cervical ACDF hardware. IMPRESSION: Acute right lung base pneumonia.  No definite pleural effusion. Electronically Signed   By: Genevie Ann M.D.   On: 12/13/2015 11:37    EKG:   Orders placed or performed during the hospital encounter of 12/04/15  . EKG 12-Lead  . EKG 12-Lead  . ED EKG  . ED EKG  . EKG    ASSESSMENT AND PLAN:   Bob Christensen is a 75 y.o. male with a known history of hypertension, hyperlipidemia, carotid stenosis, paroxysmal atrial fibrillation, recent admission last week for intra-abdominal and colon mass status post biopsy possible lymphoma admitted to [facility] 4 days ago was sent back due to abnormal labs and also confusion.  #1 sepsis-likely secondary to pneumonia. Chest x-ray with right-sided pneumonia. -Blood cultures are pending -Continue azithromycin and Zosyn. Patient is hypotensive and is on Levophed  and vasopressin at this time -MRSA screen is negative, likely vancomycin discontinued by the intensivist  #2 Acute hypoxic respiratory failure-intubated and on ventilator. -Appreciate pulmonary critical care consultation. -Still requiring 65% FiO2 and PEEP of 10 -Continue antibiotics for now  #3 acute renal failure-ATN from sepsis -Worsening renal function and also oliguria. -Continue to monitor. If does not improve might have to consider CRRT if patient is stable and if family wants dialysis. That needs to be addressed  #3 Hypercalcemia- likely from cancer and also dehydration - Improving since admission   # 4 Malignant Lymphoma-intra-abdominal mass found last week, status post biopsy results pending. 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 -Schedule for bone marrow biopsy this hospitalization-not stable enough for that -Abdominal distention-3 L ascites fluid drawn last week,  Abdominal distention worsens, will order an ultrasound and consider paracentesis.  #5 paroxysmal atrial fibrillation-Cardizem 3 times a day. -No anticoagulation due to need for biopsies  #6 BPH-continue home medications. -On Flomax and Proscar  #6 Neuropathy-continue gabapentin  #7 DVT prophylaxis-on Lovenox-  Patient is critically ill and high risk for cardiorespiratory arrest. Palliative care consulted.  Discussed prognosis with patient's family at bedside Also discussed with Dr. Grayland Ormond  All the records are reviewed and case discussed with Care Management/Social Workerr. Management plans discussed with the patient, family and they are in agreement.  CODE STATUS: DNR  TOTAL CRITICAL CARE TIME SPENT IN TAKING CARE OF THIS PATIENT: 42 minutes.  Gladstone Lighter M.D on 01-10-2016 at 4:14 PM  Between 7am to 6pm - Pager - [phone]  After 6pm go to [email or web address] - password [email or web address] [facility]  Office  [phone]  CC: Primary care  physician; Lelon Huh, MD

## 2016-01-10 NOTE — Consult Note (Signed)
Palliative Medicine Inpatient Consult Note   Name: Bob Christensen Date: 12-20-2015 MRN: [id number]  DOB: Nov 09, 1941  Referring Physician: Gladstone Lighter, MD  Palliative Care consult requested for this 75 y.o. male for goals of medical therapy in patient with   TODAY'S DISCUSSIONS AND DECISIONS: I have talked with both the critical care physician and the pt's attending today.  Both of them have had long conversations with the pt's daughter and other family members about degree of aggressiveness of care desired.  At this time, the daughter is wanting to wait on the final path report. Dr Maryjane Hurter had informed Dr. Tressia Miners that the preliminary path report shows and 'aggressive' lymphoid malignancy.  Family wants to make informed decisions and would like the final path report to be available before they make more decisions.    It is concerning, however, that he continues to worsen and they may need to make serious decisions before the final report comes back (especially if this takes some time).    I will follow along.  I do not want to overburden the family with an  Additional serious discussion today, so I will hold off talking with them today.  We will know a bit more about his condition tomorrow (it is likely worsening) and at that time, it may be appropriate for me to be involved discussing various options for comfort type of care etc.       IMPRESSION Acute respiratory failure  --intubated and ventilated Malignant Lymphoid Neoplasm ---with recent CT guided biopsy of retroperitoneal lymphadenopathy with preliminary path showing malignant lymphoid neoplasm, but no final report is in as yet   REVIEW OF SYSTEMS:  Patient is not able to provide ROS due to critical illness.  SPIRITUAL SUPPORT SYSTEM: Yes.  SOCIAL HISTORY:  reports that he quit smoking about 27 years ago. His smoking use included Cigarettes. He has a 165 pack-year smoking history. He does not have any smokeless tobacco  history on file. He reports that he does not drink alcohol or use illicit drugs.  LEGAL DOCUMENTS:  I placed a DNR form in the paper chart.  CODE STATUS: DNR  PAST MEDICAL HISTORY: Past Medical History  Diagnosis Date  . History of colon polyps 2012    a. Tubular adenoma excised by Tiffany Kocher  . History of kidney stones   . Osteoarthritis   . Brachial neuritis   . GI bleed     a. Following polypectomy  . Biceps tendon rupture 2015  . Malignant melanoma of skin of scalp ([place]) 2011    a. Excised 2001 [facility])  . PAF (paroxysmal atrial fibrillation) ([place])     a. 1997 - no known recurrence since;  b. CHA2DS2VASc = 4.  . Carotid arterial disease ([place])     a. 07/2014 Carotid U/S: bilat 0-39% stenosis.  . Degenerative joint disease     a. 2010 s/p L THA;  b. 2016 - pending L knee arthroplasty.  . H/O cardiac catheterization     a. 1997 - reportedly normal.  . Essential hypertension   . Diet-controlled diabetes mellitus ([place])   . Diabetes mellitus without complication ([place])   . Colonic mass     PAST SURGICAL HISTORY:  Past Surgical History  Procedure Laterality Date  . Total hip arthroplasty Left 2010  . Melanoma on head  2011  . Lumbar laminectomy  2006    Dr Carloyn Manner  . Hand surgery  2012  . Carpal tunnel release  10/23/2011  left hand  . Lateral fusion lumbar spine, thoracotomy    . Melanoma excision Left 11/2011    Shoulder  . Cataract extraction, bilateral Bilateral 2013  . Nerve surgery Left 2011    Elbow  . Ulnar nerve repair  2010    Row  . Total knee arthroplasty Left 09/27/2015    Procedure: TOTAL KNEE ARTHROPLASTY;  Surgeon: Leanor Kail, MD;  Location: [facility] ORS;  Service: Orthopedics;  Laterality: Left;  . Joint replacement Left     knee  . Colonoscopy with propofol N/A 12/06/2015    Procedure: COLONOSCOPY WITH PROPOFOL;  Surgeon: Lucilla Lame, MD;  Location: [facility] ENDOSCOPY;  Service: Endoscopy;  Laterality: N/A;    ALLERGIES:  is allergic  to digoxin.  MEDICATIONS:  Current Facility-Administered Medications  Medication Dose Route Frequency Provider Last Rate Last Dose  . acetaminophen (TYLENOL) tablet 650 mg  650 mg Oral Q6H PRN Gladstone Lighter, MD   650 mg at 12-16-2015 0906   Or  . acetaminophen (TYLENOL) suppository 650 mg  650 mg Rectal Q6H PRN Gladstone Lighter, MD   650 mg at 12/16/15 0420  . allopurinol (ZYLOPRIM) tablet 200 mg  200 mg Oral Maury Dus, MD   200 mg at December 16, 2015 0905  . antiseptic oral rinse (CPC / CETYLPYRIDINIUM CHLORIDE 0.05%) solution 7 mL  7 mL Mouth Rinse BID Demetrios Loll, MD   7 mL at 12-16-15 1000  . antiseptic oral rinse solution (CORINZ)  7 mL Mouth Rinse QID Wilhelmina Mcardle, MD   7 mL at 2015-12-16 1356  . azithromycin (ZITHROMAX) 500 mg in dextrose 5 % 250 mL IVPB  500 mg Intravenous Q24H Wilhelmina Mcardle, MD   500 mg at 12/13/15 2314  . calcitonin (MIACALCIN) injection 400 Units  400 Units Subcutaneous Q12H Demetrios Loll, MD   400 Units at Dec 16, 2015 1057  . chlorhexidine gluconate (PERIDEX) 0.12 % solution 15 mL  15 mL Mouth Rinse BID Wilhelmina Mcardle, MD   15 mL at Dec 16, 2015 0909  . dextrose 5 %-0.45 % sodium chloride infusion   Intravenous Continuous Gladstone Lighter, MD 100 mL/hr at 12-16-2015 1351    . dextrose 50 % solution           . docusate (COLACE) 50 MG/5ML liquid 100 mg  100 mg Per Tube BID Wilhelmina Mcardle, MD   100 mg at 12-16-15 0905  . enoxaparin (LOVENOX) injection 30 mg  30 mg Subcutaneous Q24H Gladstone Lighter, MD      . fentaNYL (SUBLIMAZE) injection 25-100 mcg  25-100 mcg Intravenous Q1H PRN Wilhelmina Mcardle, MD      . fentaNYL 2548mcg in NS 25mL (58mcg/ml) infusion-PREMIX  25-400 mcg/hr Intravenous Continuous Vishal Mungal, MD 6 mL/hr at 12-16-2015 0700 60 mcg/hr at 12-16-15 0700  . gabapentin (NEURONTIN) capsule 300 mg  300 mg Per Tube QID Wilhelmina Mcardle, MD   300 mg at December 16, 2015 1350  . insulin aspart (novoLOG) injection 0-15 Units  0-15 Units Subcutaneous 6 times per day  Laverle Hobby, MD   0 Units at 12/16/2015 1342  . midazolam (VERSED) injection 2 mg  2 mg Intravenous Q2H PRN Wilhelmina Mcardle, MD      . norepinephrine (LEVOPHED) 16 mg in dextrose 5 % 250 mL (0.064 mg/mL) infusion  0-40 mcg/min Intravenous Titrated Gladstone Lighter, MD 46.9 mL/hr at 12/16/15 1228 50 mcg/min at 12/16/2015 1228  . ondansetron (ZOFRAN) injection 4 mg  4 mg Intravenous Q6H PRN Gladstone Lighter, MD      .  pantoprazole (PROTONIX) injection 40 mg  40 mg Intravenous Q24H Wilhelmina Mcardle, MD   40 mg at 12/13/15 2200  . [START ON 12/15/2015] piperacillin-tazobactam (ZOSYN) IVPB 3.375 g  3.375 g Intravenous Q12H Gladstone Lighter, MD      . sodium chloride flush (NS) 0.9 % injection 3 mL  3 mL Intravenous Q12H Gladstone Lighter, MD   3 mL at 2015/12/18 0910  . vasopressin (PITRESSIN) 40 Units in sodium chloride 0.9 % 250 mL (0.16 Units/mL) infusion  0.04 Units/min Intravenous Continuous Laverle Hobby, MD 15 mL/hr at 12/18/2015 1206 0.04 Units/min at 18-Dec-2015 1206    Vital Signs: BP 91/55 mmHg  Pulse 115  Temp(Src) 98.8 F (37.1 C) (Axillary)  Resp 29  Ht 5\' 5"  (1.651 m)  Wt 76.567 kg (168 lb 12.8 oz)  BMI 28.09 kg/m2  SpO2 98% Filed Weights   12/02/2015 2002 12/13/15 1318  Weight: 77.61 kg (171 lb 1.6 oz) 76.567 kg (168 lb 12.8 oz)    Estimated body mass index is 28.09 kg/(m^2) as calculated from the following:   Height as of this encounter: 5\' 5"  (1.651 m).   Weight as of this encounter: 76.567 kg (168 lb 12.8 oz).  PERFORMANCE STATUS (ECOG) : 4 - Bedbound  PHYSICAL EXAM: Sedated NAD Hrt rrr no m Lungs with ronchi Skin warm and dry      LABS: CBC:    Component Value Date/Time   WBC 11.5* 12-18-2015 0521   WBC 10.2 08/17/2015 1447   HGB 10.4* 18-Dec-2015 0521   HCT 33.2* 12/18/15 0521   HCT 34.6* 08/17/2015 1447   PLT 259 December 18, 2015 0521   PLT 184 08/17/2015 1447   MCV 88.5 12-18-15 0521   MCV 96 08/17/2015 1447   NEUTROABS 5.2 05/18/2009 1451    LYMPHSABS 2.7 05/18/2009 1451   MONOABS 0.5 05/18/2009 1451   EOSABS 0.1 05/18/2009 1451   BASOSABS 0.1 05/18/2009 1451   Comprehensive Metabolic Panel:    Component Value Date/Time   NA 141 18-Dec-2015 0521   NA 147* 08/17/2015 1447   K 5.1 Dec 18, 2015 0521   CL 109 12/18/15 0521   CO2 23 12-18-15 0521   BUN 74* 2015/12/18 0521   BUN 31* 08/17/2015 1447   CREATININE 3.16* 2015-12-18 0521   CREATININE 1.3 08/02/2014   GLUCOSE 85 12-18-2015 0521   GLUCOSE 142* 08/17/2015 1447   CALCIUM 11.7* 2015/12/18 0521   AST 20 11/27/2015 2302   ALT 15* 11/27/2015 2302   ALKPHOS 67 11/16/2015 2302   BILITOT 1.1 11/16/2015 2302   BILITOT 0.4 08/17/2015 1447   PROT 4.7* 12/02/2015 2302   PROT 6.1 08/17/2015 1447   ALBUMIN 2.5* 11/21/2015 2302   ALBUMIN 4.4 08/17/2015 1447     More than 50% of the visit was spent in counseling/coordination of care: Yes  Time Spent: 80 minutes

## 2016-01-10 NOTE — Progress Notes (Signed)
[facility] Progess Note    ASSESSMENT/PLAN    ASSESSMENT / PLAN:  PULMONARY A: Acute hypoxic respiratory failure RLL opacity c/w PNA and/or effusion P:  Vent settings established Vent bundle implemented Blood gas 7.37/32/48/18.5, appears compensated.  Chest x-ray 2-17 images reviewed: Continued right sided infiltrate consistent with pneumonia.  CARDIOVASCULAR A:  Hypotension, shock - suspect septic Sinus tachycardia H/O PAF P:  Vasopressin currently at 0.03 mics, the Levophed currently at 50 mics. Continue to wean to map of greater than 65.  RENAL A:  Metabolic acidosis Severe hypercalcemia likely due to malignancy AKI, oliguric, renal function appears worse today. Very poor candidate for HD   GASTROINTESTINAL A:  Abdominal distention  P:  SUP: IV PPI  OGT to LIS Consider nutrition today  HEMATOLOGIC A:  Intra-abdominal mass with extensive peritoneal metastases results are still pending today. Ascites.  P:  DVT px: enoxaparin Monitor CBC intermittently Transfuse per usual ICU guidelines Oncology involved. Awaiting final path interpretation  INFECTIOUS A:  Severe sepsis Suspect RLL HCAP P:  Monitor temp, WBC count Micro and abx as above   MICRO: Blood 02/01 >> pending Resp 02/01 >> pending Legionella Ag 02/01 >>  Strep Ag 02/01 >>  MRSA PCR negative.  ANTIBIOTICS: Vanc 02/01 >>  Pip-tazo 02/01 >>  Azithro 02/01 >>   ENDOCRINE A:  No issues P:  Monitor glu on chem panels Consider SSI if glu > 180   NEUROLOGIC A:  Acute encephalopathy due to hypercalcemia ICU/vent associated discomfort P:  RASS goal: -2, -3 PAD protocol  ---------------------------------------   ----------------------------------------   Name: Bob Christensen MRN: [id number] DOB: [birth year]    ADMISSION DATE:  12/11/2015    CHIEF COMPLAINT:  DYSPNEA    SUBJECTIVE:   Pt currently on the  ventilator, can not provide history or review of systems.    VITAL SIGNS: Temp:  [98.4 F (36.9 C)-103 F (39.4 C)] 102.3 F (39.1 C) (02/02 0857) Pulse Rate:  [84-117] 115 (02/02 0600) Resp:  [15-47] 29 (02/02 0600) BP: (82-121)/(42-69) 91/55 mmHg (02/02 0600) SpO2:  [85 %-100 %] 98 % (02/02 0600) FiO2 (%):  [75 %-100 %] 75 % (02/02 0300) Weight:  [168 lb 12.8 oz (76.567 kg)] 168 lb 12.8 oz (76.567 kg) (02/01 1318) HEMODYNAMICS:   VENTILATOR SETTINGS: Vent Mode:  [-] PRVC FiO2 (%):  [75 %-100 %] 75 % Set Rate:  [20 bmp] 20 bmp Vt Set:  [600 mL] 600 mL PEEP:  [5 cmH20-10 cmH20] 10 cmH20 INTAKE / OUTPUT:  Intake/Output Summary (Last 24 hours) at 2016/01/08 0900 Last data filed at January 08, 2016 0604  Gross per 24 hour  Intake 2918.75 ml  Output    125 ml  Net 2793.75 ml    PHYSICAL EXAMINATION: Physical Examination:   VS: BP 91/55 mmHg  Pulse 115  Temp(Src) 102.3 F (39.1 C) (Axillary)  Resp 29  Ht 5\' 5"  (1.651 m)  Wt 168 lb 12.8 oz (76.567 kg)  BMI 28.09 kg/m2  SpO2 98%  General Appearance: On vent, sedated Neuro:without focal findings,  HEENT: PERRLA, EOM intact. Pulmonary: normal breath sounds   CardiovascularNormal S1,S2.  No m/r/g.   Abdomen: Benign, Soft, non-tender. Renal:  No costovertebral tenderness  GU:  Not performed at this time. Endocrine: No evident thyromegaly. Skin:   warm, no rashes, no ecchymosis  Extremities: normal, no cyanosis, clubbing.   LABS:   LABORATORY PANEL:   CBC  Recent Labs Lab January 08, 2016 0521  WBC 11.5*  HGB 10.4*  HCT 33.2*  PLT 259    Chemistries   Recent Labs Lab 12/10/2015 2302  01-03-16 0521  NA 140  < > 141  K 4.0  < > 5.1  CL 106  < > 109  CO2 24  < > 23  GLUCOSE 98  < > 85  BUN 52*  < > 74*  CREATININE 1.75*  < > 3.16*  CALCIUM 13.6*  < > 11.7*  AST 20  --   --   ALT 15*  --   --   ALKPHOS 67  --   --   BILITOT 1.1  --   --   < > = values in this interval not displayed.   Recent Labs Lab  12/13/15 1701  GLUCAP 104*    Recent Labs Lab 12/13/15 0940 12/13/15 1955 12/13/15 2152  PHART 7.50* 7.56* 7.37  PCO2ART 36 21* 32  PO2ART 65* 64* 48*    Recent Labs Lab 11/21/2015 2302  AST 20  ALT 15*  ALKPHOS 67  BILITOT 1.1  ALBUMIN 2.5*    Cardiac Enzymes No results for input(s): TROPONINI in the last 168 hours.  RADIOLOGY:  Dg Abd 1 View  01/03/2016  CLINICAL DATA:  Respiratory failure assess NG tube placement EXAM: ABDOMEN - 1 VIEW COMPARISON:  Chest x-ray dated 01-03-16 FINDINGS: A tubular structures demonstrated assumed to reflect the nasogastric tube with the proximal port in the proximal gastric fundus. The distal port is not clearly visible. The visualized portions a of the abdomen reveal a paucity of bowel gas. There is calcification which may lie in the left kidney. There are degenerative changes of the lumbar spine. There is infiltrate in the right lung base. IMPRESSION: The nasogastric tube tip is not clearly evident being obscured by the upper lumbar spine. The proximal port appears to lie below the GE junction in the region of the proximal gastric fundus. Electronically Signed   By: David  Martinique M.D.   On: 01/03/16 07:29   US Venous Img Lower Bilateral  12/13/2015  CLINICAL DATA:  Bilateral lower extremity pain and swelling for 1 week. History of melanoma and left total knee replacement -09/27/2015 Evaluate for DVT. EXAM: BILATERAL LOWER EXTREMITY VENOUS DOPPLER ULTRASOUND TECHNIQUE: Gray-scale sonography with graded compression, as well as color Doppler and duplex ultrasound were performed to evaluate the lower extremity deep venous systems from the level of the common femoral vein and including the common femoral, femoral, profunda femoral, popliteal and calf veins including the posterior tibial, peroneal and gastrocnemius veins when visible. The superficial great saphenous vein was also interrogated. Spectral Doppler was utilized to evaluate flow at rest  and with distal augmentation maneuvers in the common femoral, femoral and popliteal veins. COMPARISON:  Left knee radiographs - 09/27/2015). FINDINGS: RIGHT LOWER EXTREMITY Common Femoral Vein: No evidence of thrombus. Normal compressibility, respiratory phasicity and response to augmentation. Saphenofemoral Junction: No evidence of thrombus. Normal compressibility and flow on color Doppler imaging. Profunda Femoral Vein: No evidence of thrombus. Normal compressibility and flow on color Doppler imaging. Femoral Vein: No evidence of thrombus. Normal compressibility, respiratory phasicity and response to augmentation. Popliteal Vein: No evidence of thrombus. Normal compressibility, respiratory phasicity and response to augmentation. Calf Veins: No evidence of thrombus. Normal compressibility and flow on color Doppler imaging. Superficial Great Saphenous Vein: No evidence of thrombus. Normal compressibility and flow on color Doppler imaging. Venous Reflux:  None. Other Findings:  None. LEFT LOWER EXTREMITY Common Femoral Vein: No evidence of thrombus.  Normal compressibility, respiratory phasicity and response to augmentation. Saphenofemoral Junction: No evidence of thrombus. Normal compressibility and flow on color Doppler imaging. Profunda Femoral Vein: No evidence of thrombus. Normal compressibility and flow on color Doppler imaging. Femoral Vein: No evidence of thrombus. Normal compressibility, respiratory phasicity and response to augmentation. Popliteal Vein: No evidence of thrombus. Normal compressibility, respiratory phasicity and response to augmentation. Calf Veins: No evidence of thrombus. Normal compressibility and flow on color Doppler imaging. Superficial Great Saphenous Vein: No evidence of thrombus. Normal compressibility and flow on color Doppler imaging. Venous Reflux:  None. Other Findings:  None. IMPRESSION: No evidence of DVT within either lower extremity. Electronically Signed   By: Sandi Mariscal  M.D.   On: 12/13/2015 15:10   Portable Chest Xray  2016-01-09  CLINICAL DATA:  Intubation. EXAM: PORTABLE CHEST 1 VIEW COMPARISON:  12/13/2015. FINDINGS: Endotracheal tube, NG tube, right PICC line in stable position. Mediastinum hilar structures are stable. Persistent dense right pulmonary infiltrate. Interim clearing of right pleural effusion. Low lung volumes with basilar atelectasis. Heart size stable. Prior cervical spine fusion. IMPRESSION: 1. Lines and tubes in stable position. 2. Interim clearing of right pleural effusion. Persistent dense right lung infiltrate without change. Electronically Signed   By: [place]   On: 01/09/16 07:30   Portable Chest Xray  12/13/2015  CLINICAL DATA:  Respiratory distress. Status post intubation and central line placement. EXAM: PORTABLE CHEST 1 VIEW COMPARISON:  12/13/2015 FINDINGS: The ET tube tip is above the carina. Right subclavian catheter tip is in the projection of the SVC. No pneumothorax identified. Heart size is normal. There is atelectasis in the left base. Right pleural effusion is identified and there is decreased aeration to the right midlung and right base. IMPRESSION: 1. Satisfactory position of right subclavian central venous catheter and ET tube. 2. No change in aeration right lung compared with prior exam. Electronically Signed   By: Kerby Moors M.D.   On: 12/13/2015 21:23   Dg Chest Port 1 View  12/13/2015  CLINICAL DATA:  Acute respiratory failure and hypoxemia EXAM: PORTABLE CHEST 1 VIEW COMPARISON:  12/13/2015 FINDINGS: Normal heart size. There is a right plural effusion which has increased in volume from previous exam. There has been worsening airspace opacification within the right midlung and right base. IMPRESSION: 1. Worsening aeration to the right lung base. 2. Suspect increase in volume of right effusion. Electronically Signed   By: Kerby Moors M.D.   On: 12/13/2015 19:51   Dg Chest Port 1 View  12/13/2015  CLINICAL DATA:   75 year old male with hypertension status post biopsy of abdominal mass. Fever. Initial encounter. EXAM: PORTABLE CHEST 1 VIEW COMPARISON:  CT Abdomen and Pelvis 12/07/2015 and earlier. FINDINGS: Portable AP semi upright view at 1106 hours. New confluent airspace disease at the right lung base. No definite pleural effusion. Somewhat low lung volumes. Normal cardiac size and mediastinal contours. Visualized tracheal air column is within normal limits. No pneumothorax or pulmonary edema. Partially visible cervical ACDF hardware. IMPRESSION: Acute right lung base pneumonia.  No definite pleural effusion. Electronically Signed   By: Genevie Ann M.D.   On: 12/13/2015 11:37       --Marda Stalker, MD.  [facility]   Patricia Pesa, M.D.  Vilinda Boehringer, M.D.  Merton Border, M.D   [place].  I have personally obtained a history, examined the patient, evaluated laboratory and imaging results, formulated the assessment and plan and placed orders. The  Patient requires high complexity decision making for assessment and support, frequent evaluation and titration of therapies, application of advanced monitoring technologies and extensive interpretation of multiple databases. The patient has critical illness that could lead imminently to failure of 1 or more organ systems and requires the highest level of physician preparedness to intervene.  Critical Care Time devoted to patient care services described in this note is 35 minutes and is exclusive of time spent in procedures.

## 2016-01-10 NOTE — Progress Notes (Signed)
[facility] Progress Note Patient Name: Bob Christensen DOB: Oct 19, 1941 MRN: [id number]   Date of Service  2015-12-19  HPI/Events of Note  Patient with declining BP and worsening Hypoxia despite aggressive measures.  Spoke with Son, Tag, who stated that he was coming to see his father and will decide on further medical care  eICU Interventions  Cont with current management       Intervention Category Major Interventions: Other:  Bob Christensen 2015/12/19, 12:28 AM

## 2016-01-10 NOTE — Progress Notes (Signed)
Initial Nutrition Assessment     INTERVENTION:  Coordination of care: Dr Juanell Fairly wanting to hold off on starting nutrition at this time. Reassess tomorrow   NUTRITION DIAGNOSIS:   Inadequate oral intake related to acute illness as evidenced by NPO status.    GOAL:   Patient will meet greater than or equal to 90% of their needs    MONITOR:    (Energy intake, Digestive system)  REASON FOR ASSESSMENT:   Ventilator    ASSESSMENT:      Pt admitted with AMS and hypercalcemia. Transferred to ICU with dyspnea, hypotension, RLL infiltrate, failed bipap so was intubated.   Past Medical History  Diagnosis Date  . History of colon polyps 2012    a. Tubular adenoma excised by Tiffany Kocher  . History of kidney stones   . Osteoarthritis   . Brachial neuritis   . GI bleed     a. Following polypectomy  . Biceps tendon rupture 2015  . Malignant melanoma of skin of scalp ([place]) 2011    a. Excised 2001 [facility] [place] [facility])  . PAF (paroxysmal atrial fibrillation) ([place])     a. 1997 - no known recurrence since;  b. CHA2DS2VASc = 4.  . Carotid arterial disease ([place])     a. 07/2014 Carotid U/S: bilat 0-39% stenosis.  . Degenerative joint disease     a. 2010 s/p L THA;  b. 2016 - pending L knee arthroplasty.  . H/O cardiac catheterization     a. 1997 - reportedly normal.  . Essential hypertension   . Diet-controlled diabetes mellitus ([place])   . Diabetes mellitus without complication ([place])   . Colonic mass     Current Nutrition: NPO  Food/Nutrition-Related History: unable to determine at this time   Scheduled Medications:  . allopurinol  200 mg Oral [id number]  . antiseptic oral rinse  7 mL Mouth Rinse BID  . antiseptic oral rinse  7 mL Mouth Rinse QID  . azithromycin  500 mg Intravenous Q24H  . calcitonin  400 Units Subcutaneous Q12H  . chlorhexidine gluconate  15 mL Mouth Rinse BID  . dextrose      . docusate  100 mg Per Tube BID  . enoxaparin (LOVENOX) injection  40 mg  Subcutaneous Q24H  . gabapentin  300 mg Per Tube QID  . insulin aspart  0-15 Units Subcutaneous 6 times per day  . pantoprazole (PROTONIX) IV  40 mg Intravenous Q24H  . piperacillin-tazobactam (ZOSYN)  IV  3.375 g Intravenous 3 times per day  . sodium chloride  1,000 mL Intravenous Once  . sodium chloride flush  3 mL Intravenous Q12H    Continuous Medications:  . dextrose 5 % and 0.45% NaCl    . fentaNYL infusion INTRAVENOUS 60 mcg/hr (12/24/15 0700)  . norepinephrine (LEVOPHED) Adult infusion 50 mcg/min (December 24, 2015 1228)  . vasopressin (PITRESSIN) infusion - *FOR SHOCK* 0.04 Units/min (December 24, 2015 1206)     Electrolyte/Renal Profile and Glucose Profile:   Recent Labs Lab 12/05/2015 2302 12/13/15 0556 24-Dec-2015 0521  NA 140 142 141  K 4.0 4.4 5.1  CL 106 108 109  CO2 24 28 23   BUN 52* 54* 74*  CREATININE 1.75* 1.90* 3.16*  CALCIUM 13.6* 13.8* 11.7*  GLUCOSE 98 96 85   Protein Profile:  Recent Labs Lab 11/27/2015 2302  ALBUMIN 2.5*    Gastrointestinal Profile: Last BM: 1/24 ??   Nutrition-Focused Physical Exam Findings: deferred at this time   Weight Change: noted 11% wt loss in the last  3 months per wt encounters    Diet Order:  Diet NPO time specified  Skin:   reviewed   Height:   Ht Readings from Last 1 Encounters:  12/13/15 5\' 5"  (1.651 m)    Weight:   Wt Readings from Last 1 Encounters:  12/13/15 168 lb 12.8 oz (76.567 kg)    Ideal Body Weight:     BMI:  Body mass index is 28.09 kg/(m^2).  Estimated Nutritional Needs:   Kcal:  BEE 2294 kcals/d (Ve 18, Tmax 39.4)  Protein:  (1.2-2.0 g/kg) 91-152 g/d  Fluid:  (25-92ml/kg) 1900-2213ml/d  EDUCATION NEEDS:   No education needs identified at this time  HIGH Care Level  Bob Christensen, [place], [place] (pager) Weekend/On-Call pager [phone])

## 2016-01-10 NOTE — Progress Notes (Signed)
Pt extubated per MD order

## 2016-01-10 NOTE — Discharge Summary (Signed)
[place] at [facility]    Death Note - please see Last Note for all details.  PATIENT NAME: Bob Christensen    MR#:  [id number]  DATE OF BIRTH:  10/02/1941  DATE OF ADMISSION:  11/18/2015  PRIMARY CARE PHYSICIAN: Lelon Huh, MD   ADMISSION DIAGNOSIS:  Dehydration Hypercalcemia  HISTORY OF PRESENT ILLNESS ON ADMISSION (as per Dr Tressia Miners):  Bob Christensen is a 75 y.o. male with a known history of hypertension, hyperlipidemia, carotid stenosis, paroxysmal atrial fibrillation, recent admission last week for intra-abdominal and colon mass status post biopsy possible lymphoma admitted to [place] 4 days ago was sent back due to abnormal labs and also confusion. Histopathology from recent biopsy is still pending, possible lymphoma per oncology. Patient had blood work done today at [facility] and showed a calcium of 14.1. He was also complaining of extreme weakness, more confused than baseline. He was sent to see Dr. Grayland Ormond in the office who looked at the labs and requested admission. Patient also has been having worsening lower extremity edema not improving with Lasix tried at the rehabilitation. Also having worsening abdominal distention. He had 3 L of ascitic fluid taken out last week.  HOSPITAL COURSE:  Pt required intubation and mechanical ventilation and was being treated for Sepsis secondary to PNA as well as acute renal failure with ATN.  He had underlying malignant lymphoma.  Pt was intubated, but was DNR.  He declined despite treatment and expired at time listed below.    Pronounced dead by Jannifer Franklin, Jayani Rozman FIELDING on 2016/01/02 at 22:14 after 1 full minute of auscultation revealed absent heart and lung sounds, absent corneal and pupillary reflexes, and absent response to painful stimuli.                  Cause of death: Sepsis secondary to Pneumonia  Other contributing diagnoses: Acute renal failure with ATN, Hypercalcemia, Malignant  Lymphoma, Paroxysmal Afib   [name] FIELDING 2016/01/02, 10:38 PM  [place] at [place]  Total clinical and documentation time for today: <30 minutes

## 2016-01-10 NOTE — Progress Notes (Signed)
[facility] Progress Note Patient Name: Bob Christensen DOB: 04-21-1941 MRN: [id number]   Date of Service  01/02/2016  HPI/Events of Note  Worsening shock DNR  eICU Interventions  500cc fluid bolus     Intervention Category Major Interventions: Shock - evaluation and management  Simonne Maffucci January 02, 2016, 6:25 PM

## 2016-01-10 NOTE — Progress Notes (Addendum)
[facility] Progress Note Patient Name: Bob Christensen DOB: 1941-08-20 MRN: [id number]   Date of Service  2015/12/22  HPI/Events of Note  Pt with critical illness and worsening shock, on 34mcg levo, 0.3vaso, 100% fio2.  Family and son updated via camera.  eICU Interventions  Family decided on DNR status, cont with current medical care, no further escalation of care.  If his clinical status continues to worsen they will consider comfort care measures.   RN Witness - Dana  Orders and code status updated DNR.      Intervention Category Major Interventions: Other:  Bob Christensen December 22, 2015, 1:03 AM

## 2016-01-10 NOTE — Progress Notes (Signed)
Updated Dr. Juanell Fairly about patient having fever of 103 last night- blood cultures were drawn the day before-patient has a fever now of 102- no orders to draw a second set of blood cultures.

## 2016-01-10 NOTE — Progress Notes (Signed)
Anticoagulation monitoring(Lovenox):  74yo  ordered Lovenox 40 mg Q24h  Filed Weights   11/14/2015 2002 12/13/15 1318  Weight: 171 lb 1.6 oz (77.61 kg) 168 lb 12.8 oz (76.567 kg)   BMI 28  Lab Results  Component Value Date   CREATININE 3.16* Dec 31, 2015   CREATININE 1.90* 12/13/2015   CREATININE 1.75* 11/24/2015   Estimated Creatinine Clearance: 19.6 mL/min (by C-G formula based on Cr of 3.16). Hemoglobin & Hematocrit     Component Value Date/Time   HGB 10.4* Dec 31, 2015 0521   HCT 33.2* 12-31-2015 0521   HCT 34.6* 08/17/2015 1447     Per Protocol for Patient with estCrcl< 30 ml/min and BMI < 40, will transition to Lovenox 30 mg Q24h.

## 2016-01-10 NOTE — Discharge Summary (Signed)
[name] at [facility]    Death Note please see Last Note for all details.   In breif -   Bob Christensen [id number] is a 75 y.o. male, Outpatient Primary MD for the patient is Lelon Huh, MD  Bob Christensen is a 75 y.o. male with a known history of hypertension, hyperlipidemia, carotid stenosis, paroxysmal atrial fibrillation, recent admission last week for intra-abdominal and colon mass status post biopsy possible aggressive lymphoma admitted to [place] was sent back due to abnormal labs and also confusion. While patient was on the floor, he became unresponsive is noted to be septic likely secondary to pneumonia and transferred to ICU. He was intubated and on mechanical ventilator with worsening blood pressure in spite of 3 pressors. After extensive discussion with the family and the intensivist, patient was made DO NOT RESUSCITATE. He declined despite maximum medical treatment and expired the time listed below.  Final diagnosis:  #1 sepsis #2 pneumonia #3 acute renal failure #4 acute hypoxic respiratory failure #5 hypercalcemia #6 malignant aggressive lymphoma-final results pending #7 proximal atrial fibrillation #8 benign prostatitic hypertrophy #9 ascites #10 peripheral neuropathy   Pronounced dead by Dr. Lance Coon on January 12, 2016   @   2214                 Gladstone Lighter M.D on 12/15/2015 at 2:40 PM  [place] at [place] [phone]  Total clinical and documentation time for today Under 30 minutes

## 2016-01-10 NOTE — Progress Notes (Signed)
Dr. Tressia Miners made aware of blood sugar of 38- order to give dextrose 70ml.

## 2016-01-10 NOTE — Care Management (Signed)
Patient presented to 1C from [facility] then transferred to icu for septic shock and currently on Levophed and intubated.  Palliative care consult is in progress for goals of treatment.  Patient is full code

## 2016-01-10 DEATH — deceased

## 2016-01-25 ENCOUNTER — Ambulatory Visit: Payer: Medicare Other | Admitting: Family Medicine

## 2016-03-06 NOTE — Addendum Note (Signed)
Addended by: Lloyd Huger on: 03/06/2016 01:19 PM   Modules accepted: Level of Service

## 2016-07-20 IMAGING — CR DG CHEST 1V PORT
1 series · 1 of 1 positions shown · non-contrast
Comparison: 12/13/2015

CLINICAL DATA: Respiratory distress. Status post intubation and
central line placement.

EXAM:
PORTABLE CHEST 1 VIEW

[portable]
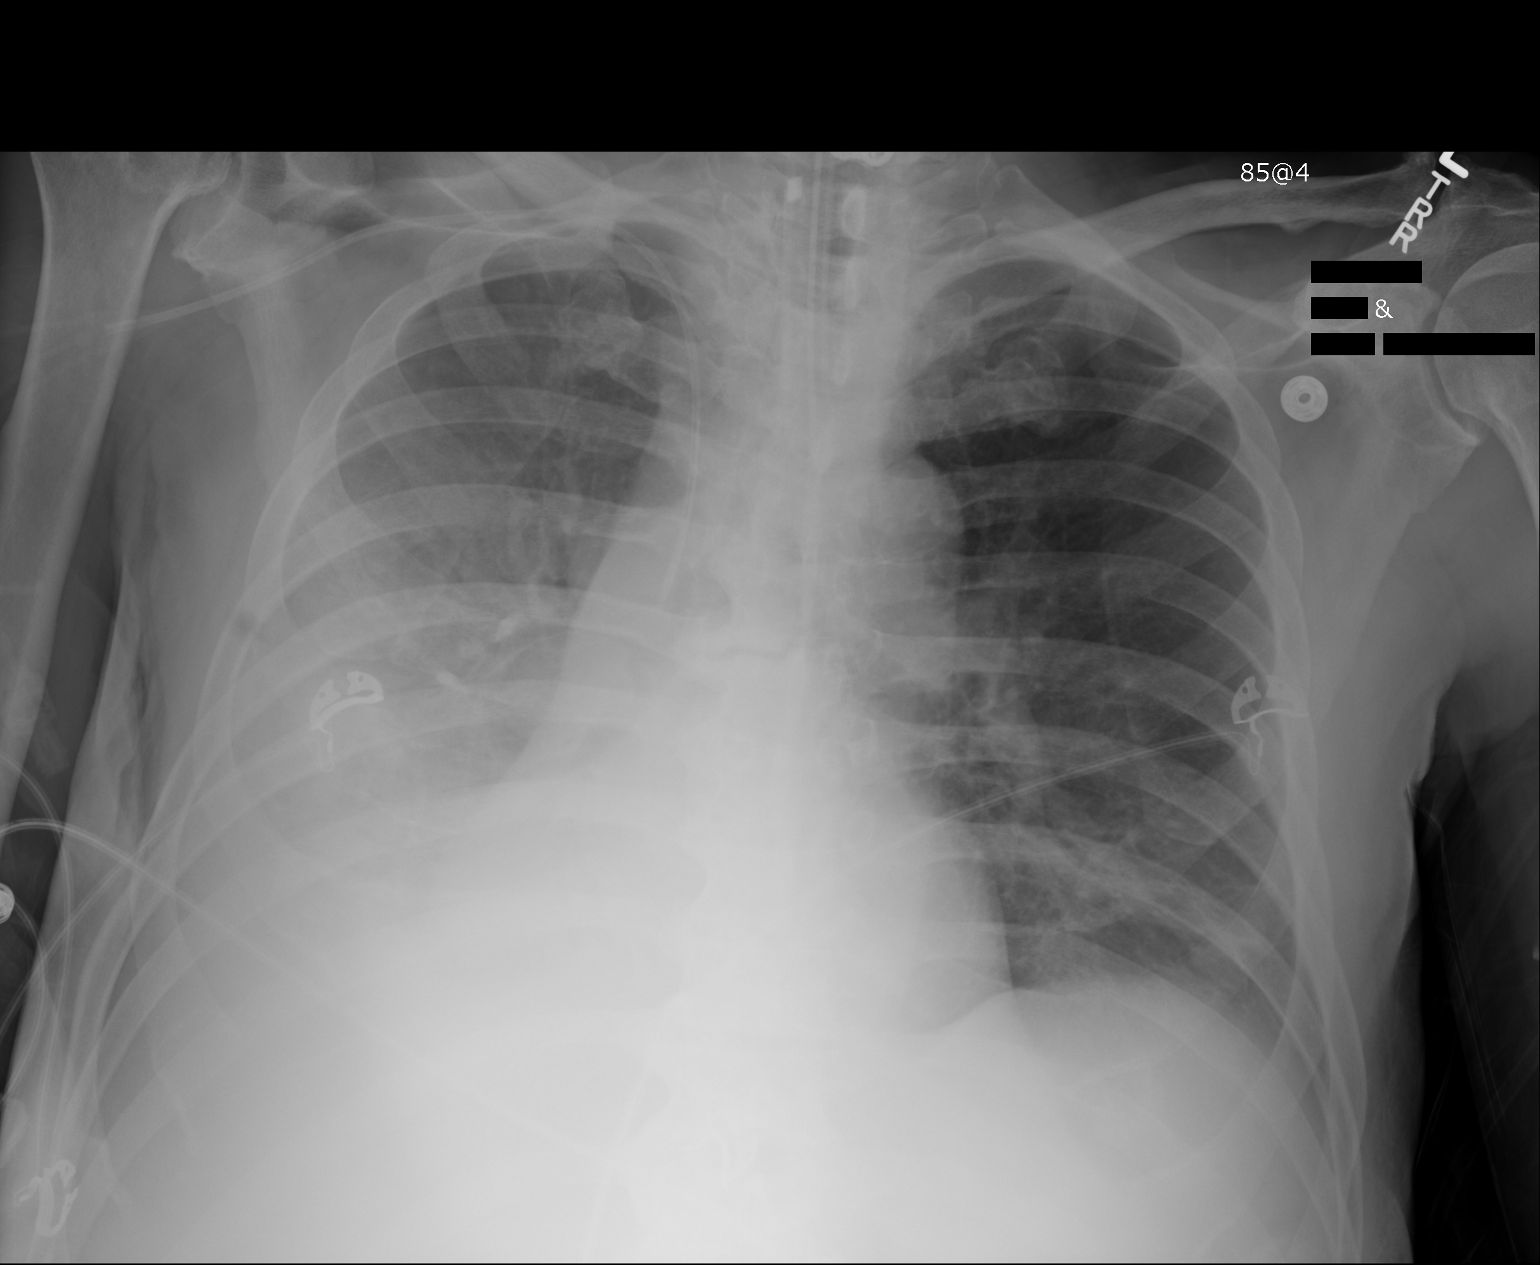

[1 of 1 positions shown; findings below may reference images not displayed]

FINDINGS: The ET tube tip is above the carina. Right subclavian catheter tip
is in the projection of the SVC. No pneumothorax identified. Heart
size is normal. There is atelectasis in the left base. Right pleural
effusion is identified and there is decreased aeration to the right
midlung and right base.
IMPRESSION: 1. Satisfactory position of right subclavian central venous catheter
and ET tube.
2. No change in aeration right lung compared with prior exam.

## 2016-07-21 IMAGING — CR DG CHEST 1V PORT
1 series · 1 of 1 positions shown · non-contrast
Comparison: 12/13/2015.

CLINICAL DATA: Intubation.

EXAM:
PORTABLE CHEST 1 VIEW

[portable]
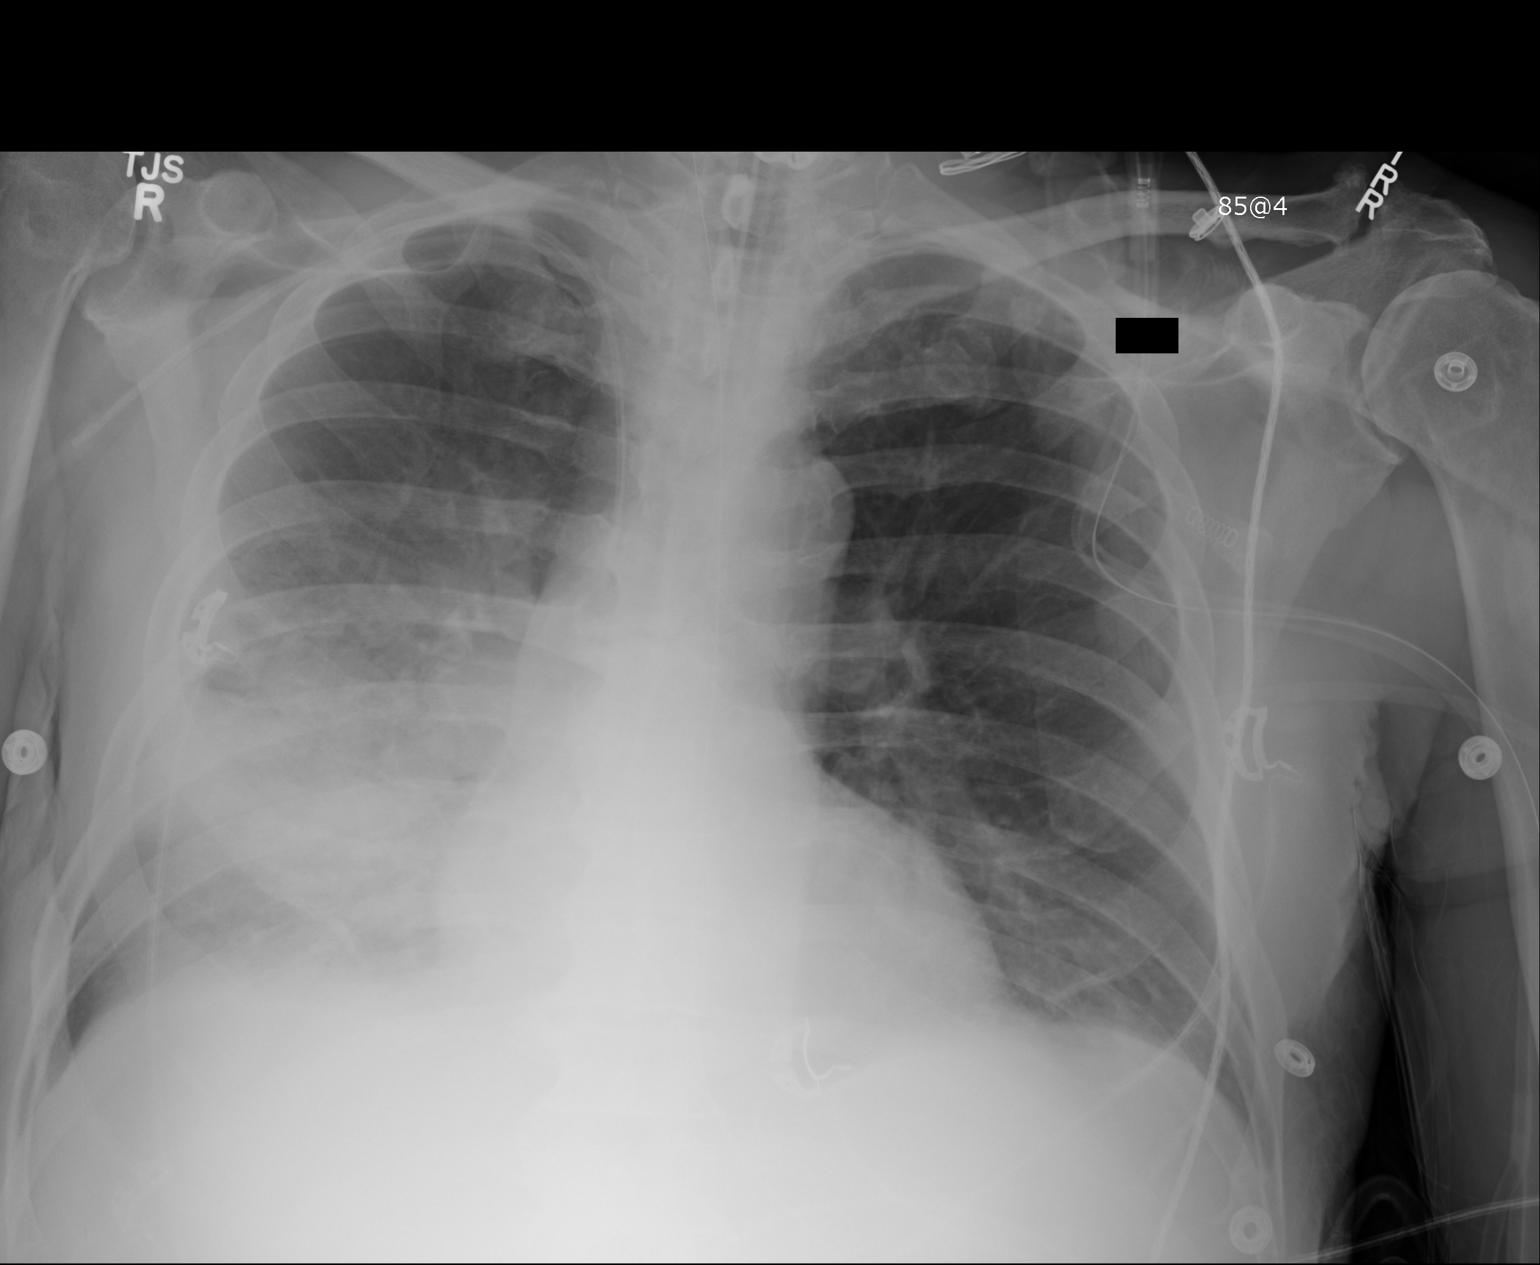

[1 of 1 positions shown; findings below may reference images not displayed]

FINDINGS: Endotracheal tube, NG tube, right PICC line in stable position.
Mediastinum hilar structures are stable. Persistent dense right
pulmonary infiltrate. Interim clearing of right pleural effusion.
Low lung volumes with basilar atelectasis. Heart size stable. Prior
cervical spine fusion.
IMPRESSION: 1. Lines and tubes in stable position.

2. Interim clearing of right pleural effusion. Persistent dense
right lung infiltrate without change.
